# Patient Record
Sex: Male | Born: 1970 | Race: Black or African American | Hispanic: No | Marital: Single | Smoking: Current every day smoker
Health system: Southern US, Community
[De-identification: ages and names within clinical notes are randomized; demographics above are authoritative.]

## PROBLEM LIST (undated history)

## (undated) DIAGNOSIS — E7800 Pure hypercholesterolemia, unspecified: Secondary | ICD-10-CM

## (undated) DIAGNOSIS — I10 Essential (primary) hypertension: Secondary | ICD-10-CM

## (undated) DIAGNOSIS — E119 Type 2 diabetes mellitus without complications: Secondary | ICD-10-CM

## (undated) HISTORY — PX: ENUCLEATION: SHX628

## (undated) HISTORY — PX: DIAPHRAGM SURGERY: SHX612

---

## 2003-06-30 ENCOUNTER — Inpatient Hospital Stay (HOSPITAL_COMMUNITY): Admission: EM | Admit: 2003-06-30 | Discharge: 2003-07-05 | Payer: Self-pay | Admitting: Psychiatry

## 2010-02-19 ENCOUNTER — Inpatient Hospital Stay: Payer: Self-pay | Admitting: Psychiatry

## 2010-03-26 ENCOUNTER — Ambulatory Visit: Payer: Self-pay | Admitting: Emergency Medicine

## 2010-06-06 ENCOUNTER — Ambulatory Visit: Payer: Self-pay | Admitting: Otolaryngology

## 2010-09-02 ENCOUNTER — Emergency Department: Payer: Self-pay | Admitting: Internal Medicine

## 2010-09-14 ENCOUNTER — Emergency Department: Payer: Self-pay | Admitting: Emergency Medicine

## 2010-09-14 IMAGING — CR DG CHEST 1V PORT
1 series · 1 of 1 positions shown · non-contrast
Comparison: none

REASON FOR EXAM: Chest Pain
COMMENTS:

PROCEDURE:     DXR - DXR PORTABLE CHEST SINGLE VIEW  - [DATE]  [DATE]
RESULT:     Mild right base atelectatic changes versus infiltrate noted. The
left lung is clear. The cardiovascular structures are unremarkable.

[view not recorded]
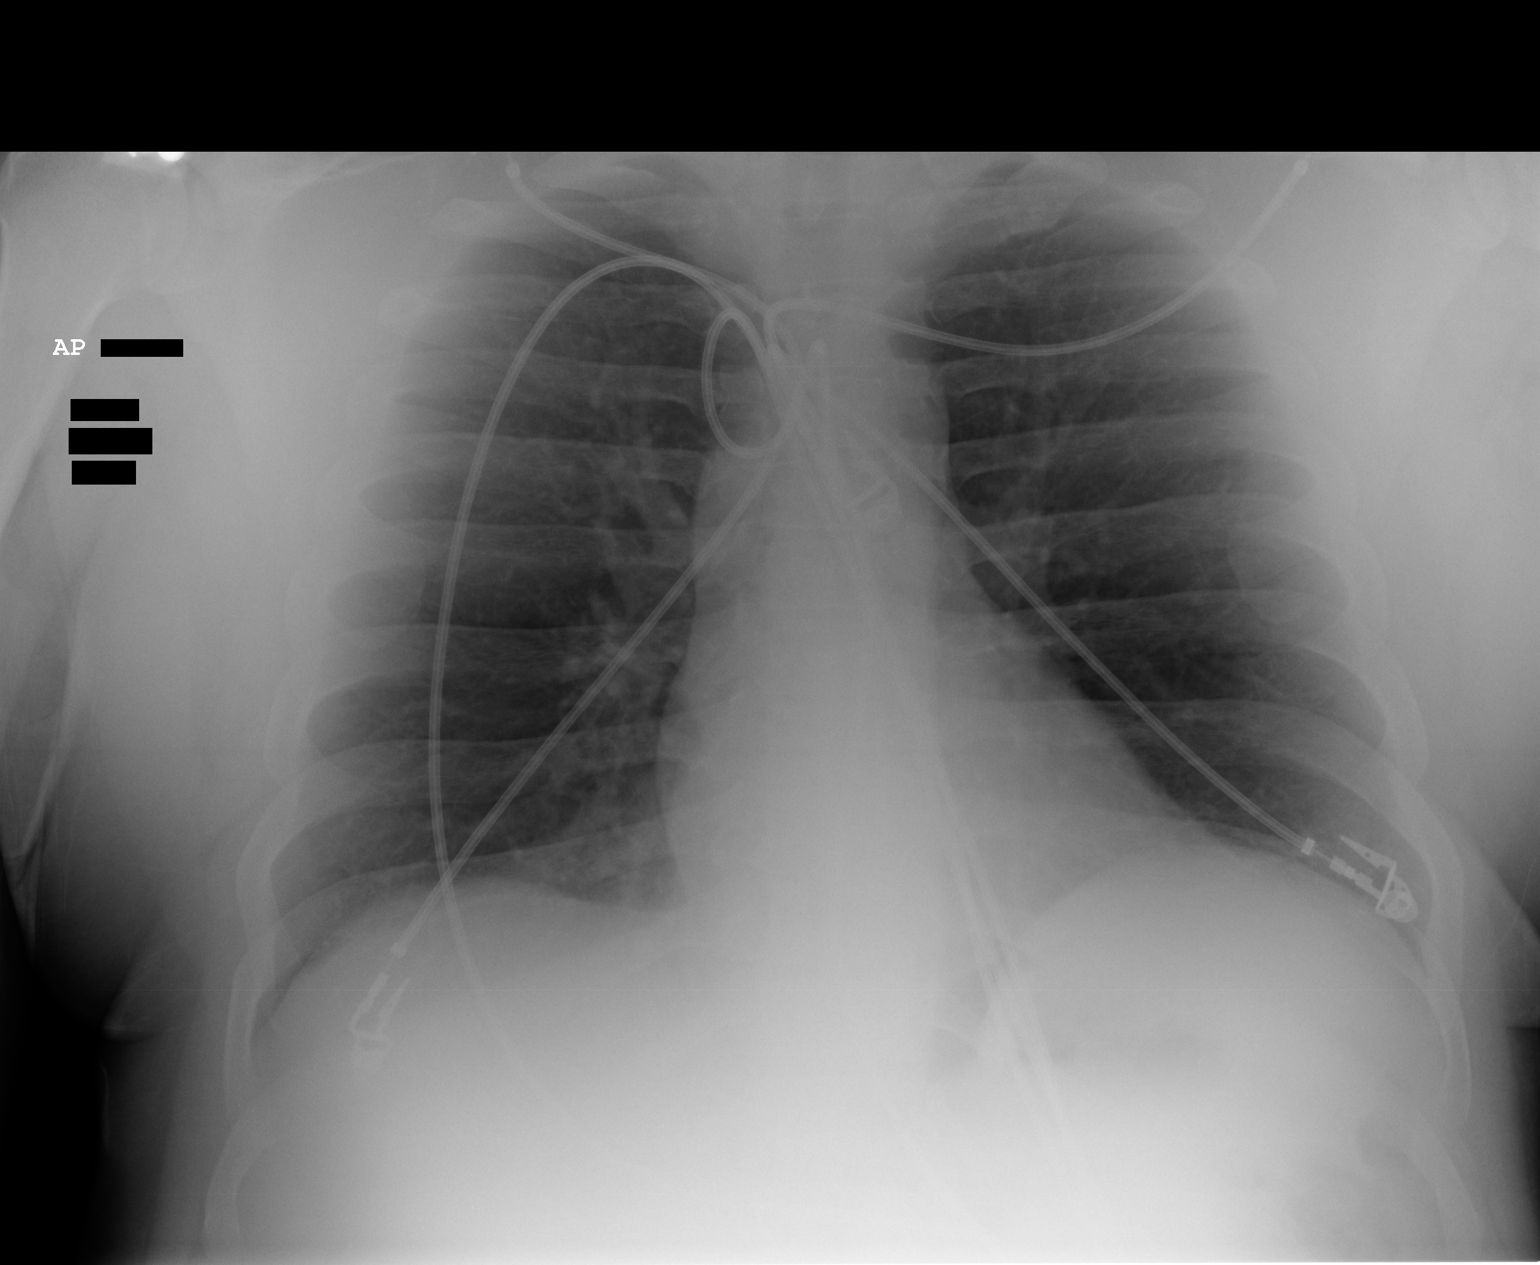

[1 of 1 positions shown; findings below may reference images not displayed]

IMPRESSION: Mild right base atelectatic changes. Pneumonia cannot be
excluded.

## 2010-09-14 IMAGING — CT CT CHEST W/ CM
2 series · 16 of 32 positions shown, 20 images · IV contrast (APPLIED)
Comparison: none

REASON FOR EXAM: chest pain  tachycardia
COMMENTS:

PROCEDURE:     CT  - CT CHEST (FOR PE) W  - [DATE]  [DATE]
RESULT:     History: Chest pain and tachycardia.

[Series 4: soft tissue · axial · 0.80mm/px · z∈[-332,-284]mm · 2 of 106 slices shown]
[im 9/106  mediastinal]
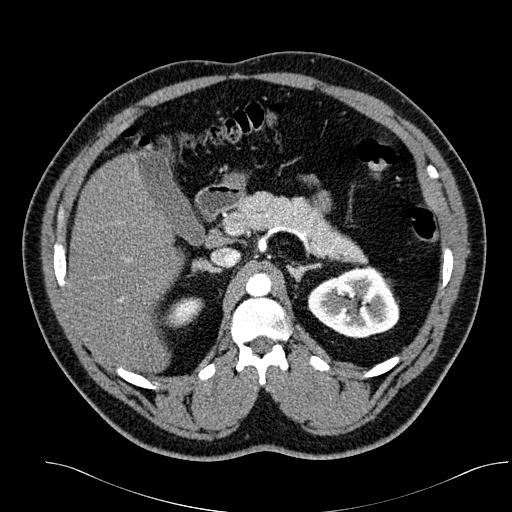
[im 25/106  mediastinal]
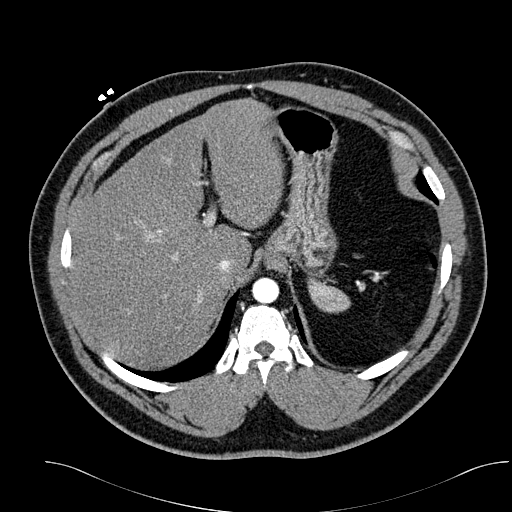

[Series 5: lung windows · axial · 0.80mm/px · z∈[-328,-64]mm · 14 of 104 slices shown, 18 images]
[im 8/104  mediastinal]
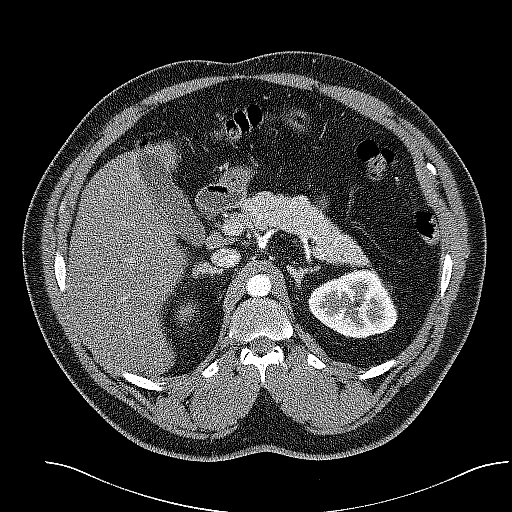
[im 8/104  lung]
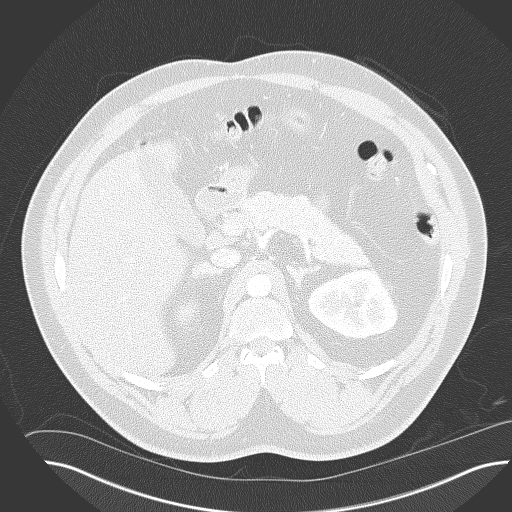
[im 16/104  lung]
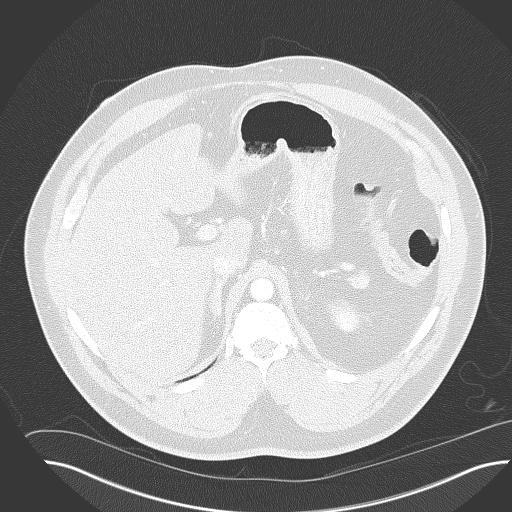
[im 24/104  lung]
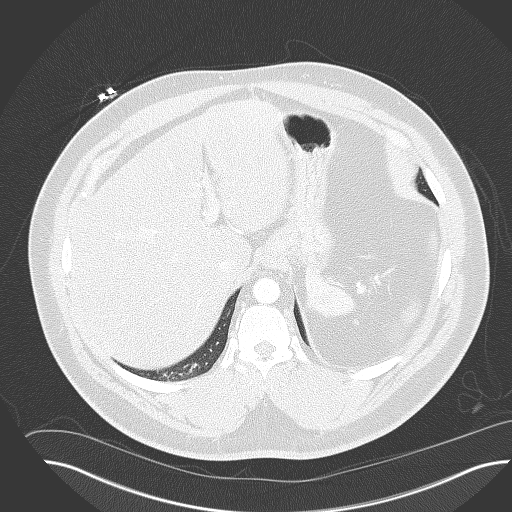
[im 32/104  lung]
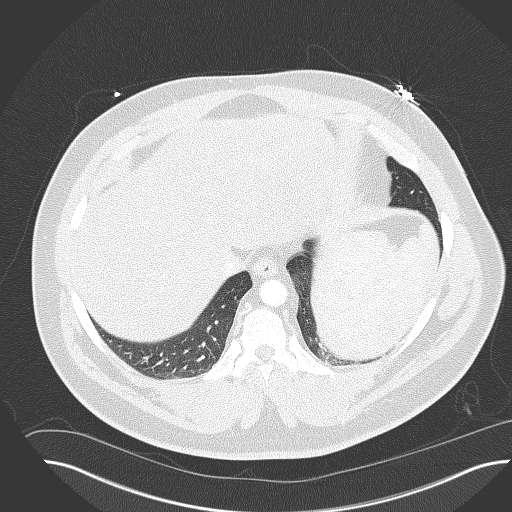
[im 40/104  mediastinal]
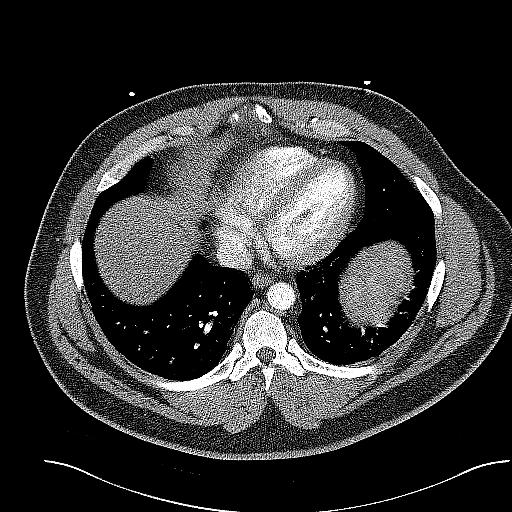
[im 40/104  lung]
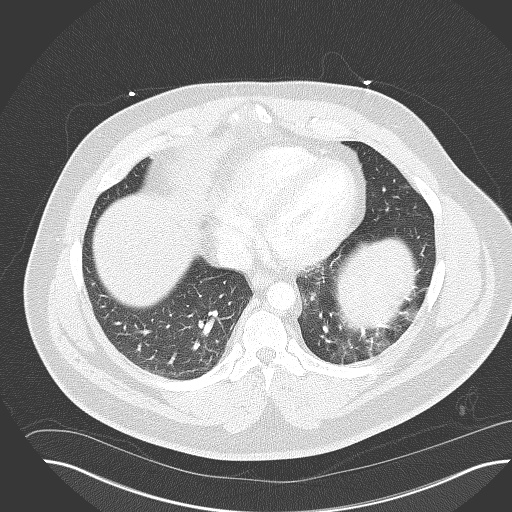
[im 48/104  lung]
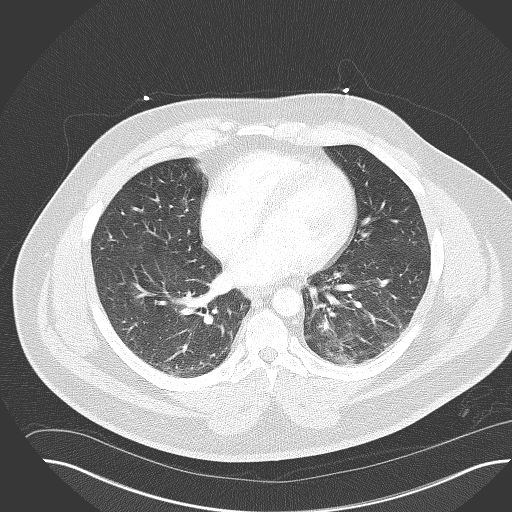
[im 49/104  lung]
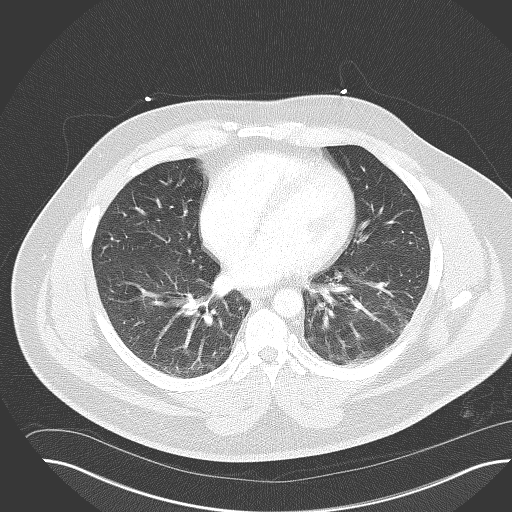
[im 52/104  lung]
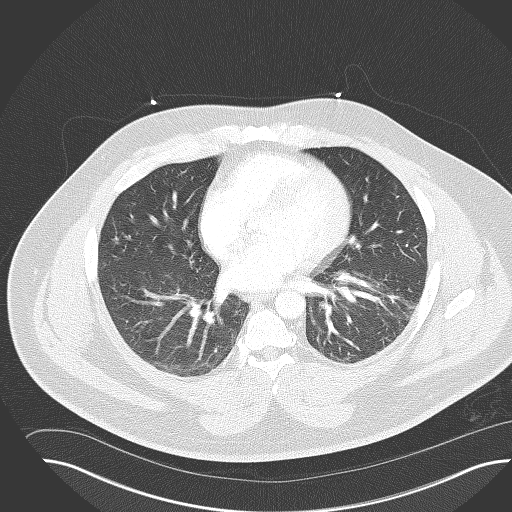
[im 56/104  mediastinal]
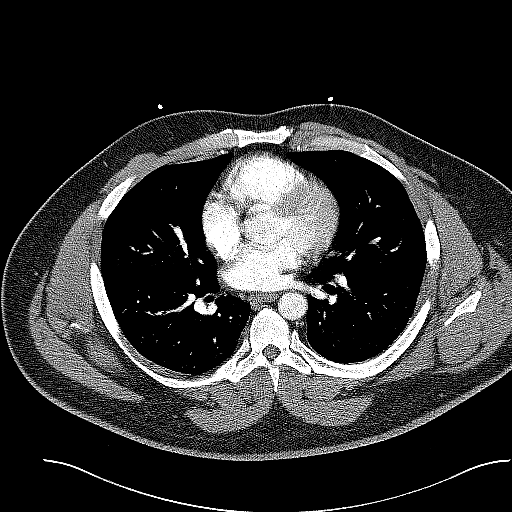
[im 56/104  lung]
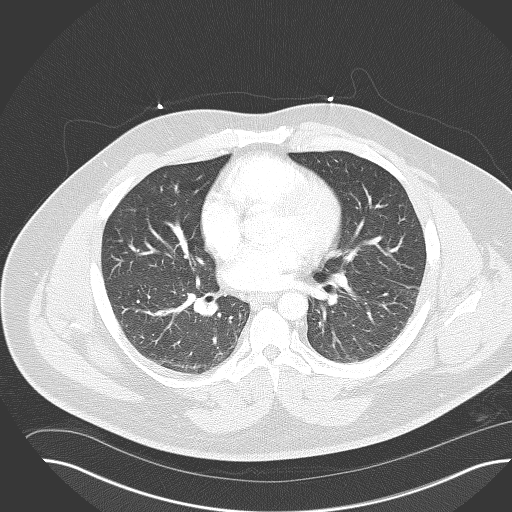
[im 64/104  lung]
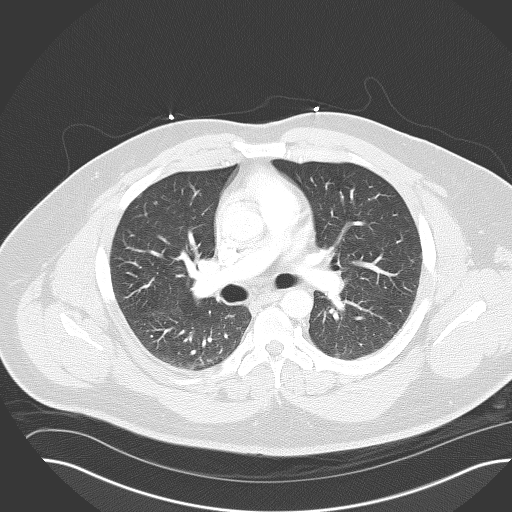
[im 72/104  lung]
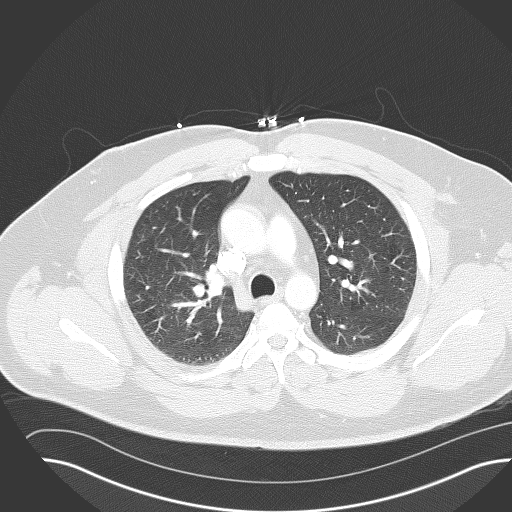
[im 80/104  lung]
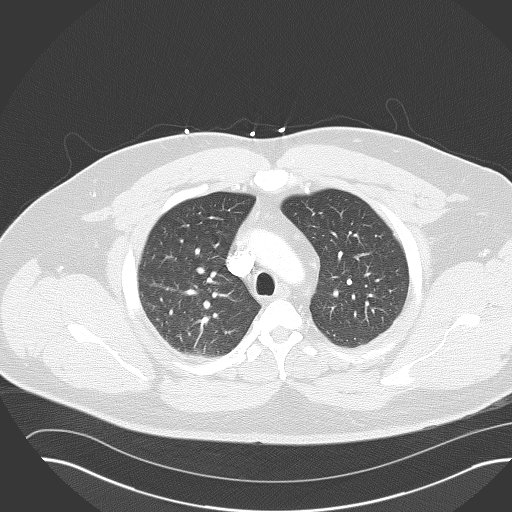
[im 88/104  mediastinal]
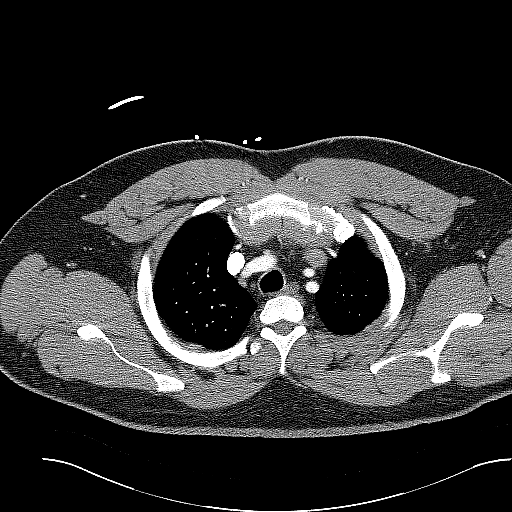
[im 88/104  lung]
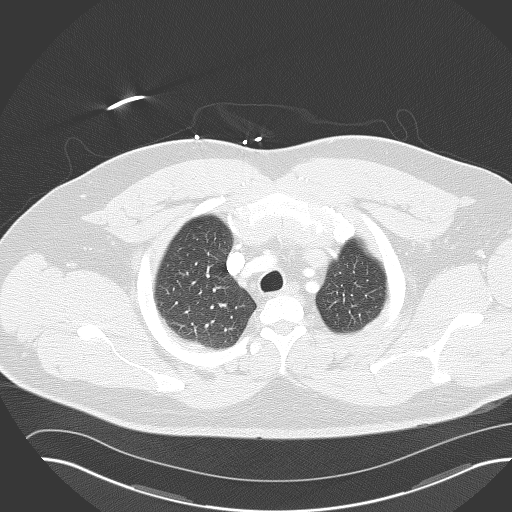
[im 96/104  lung]
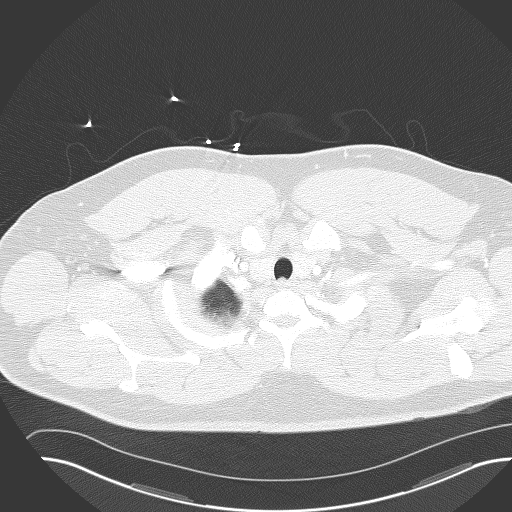

[16 of 32 positions shown; findings below may reference images not displayed]

FINDINGS: Standard CT obtained with 100 cc of [3S]. Thoracic aorta
normal. Adrenals normal. Pulmonary arteries normal. Heart size is normal.
Large airways patent. Bibasal atelectasis and/or pneumonia noted
particularly the left.
IMPRESSION: Bibasal atelectasis and/or pneumonia. No pulmonary embolus.

## 2010-12-16 ENCOUNTER — Ambulatory Visit: Payer: Self-pay | Admitting: Internal Medicine

## 2010-12-17 ENCOUNTER — Ambulatory Visit: Payer: Self-pay | Admitting: Gastroenterology

## 2010-12-23 ENCOUNTER — Ambulatory Visit: Payer: Self-pay | Admitting: Gastroenterology

## 2010-12-23 IMAGING — US ULTRASOUND CORE BIOPSY
1 series · 8 of 8 positions shown · non-contrast
Comparison: none

REASON FOR EXAM: Hepatitis C
COMMENTS:

[Series 1: ultrasound core biopsy · 8 of 8 slices shown]
[im 1/8]
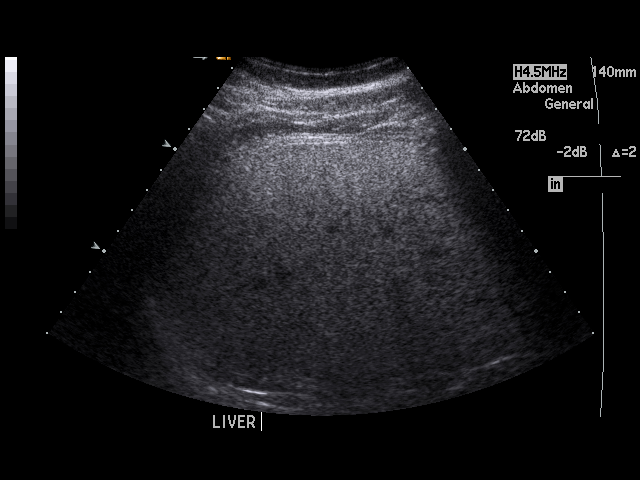
[im 2/8]
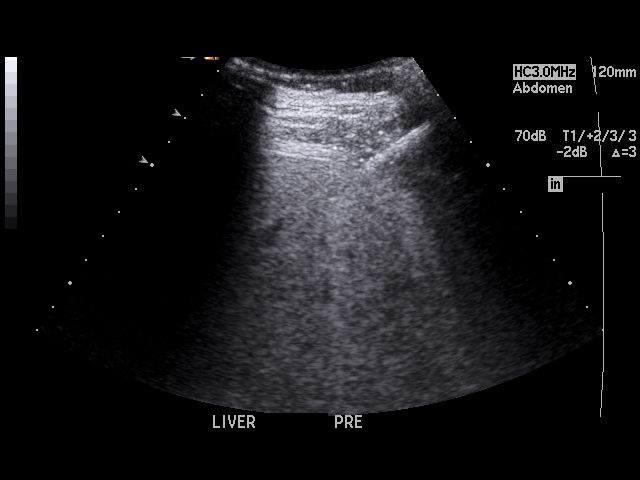
[im 3/8]
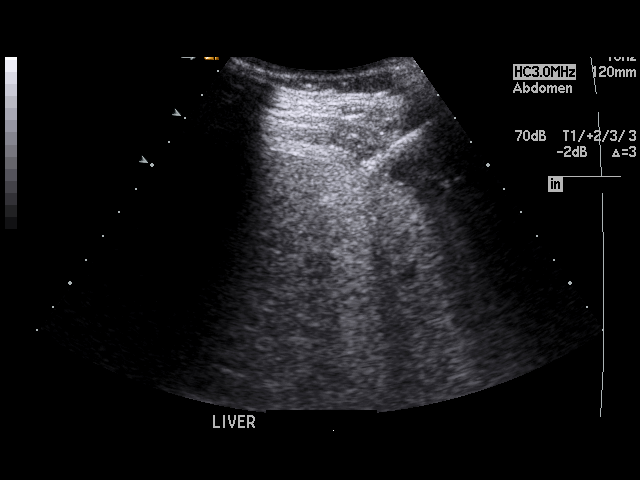
[im 4/8]
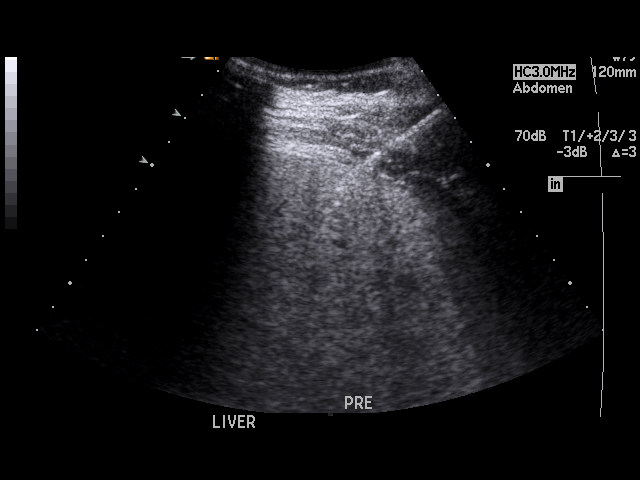
[im 5/8]
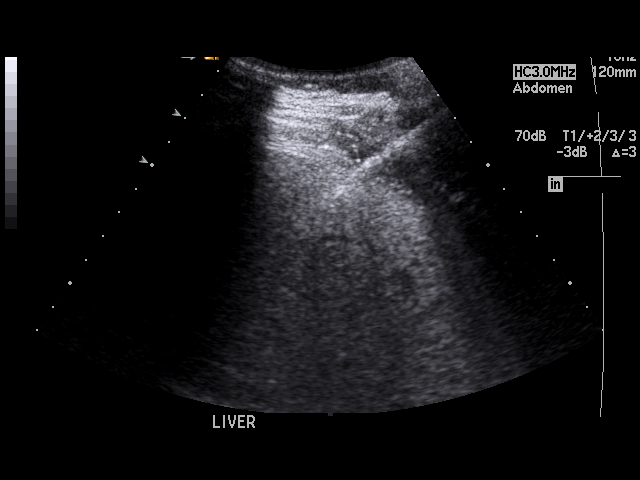
[im 6/8]
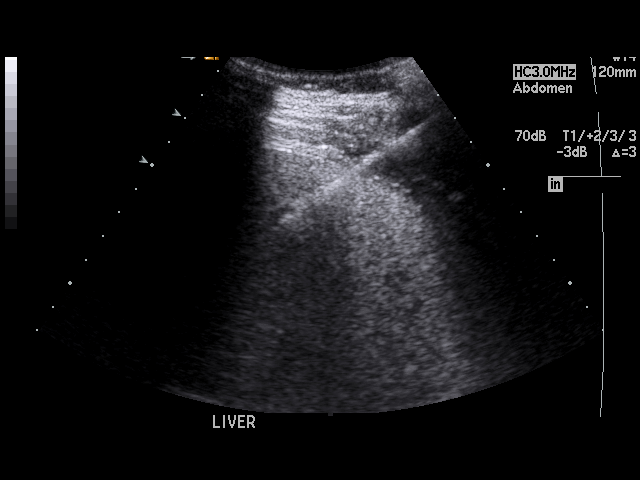
[im 7/8]
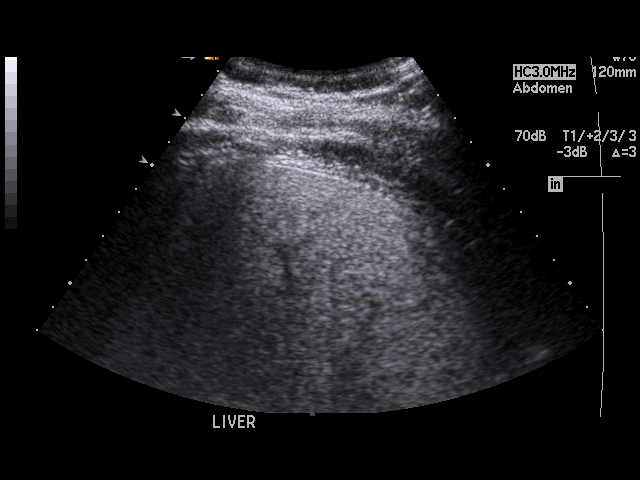
[im 8/8]
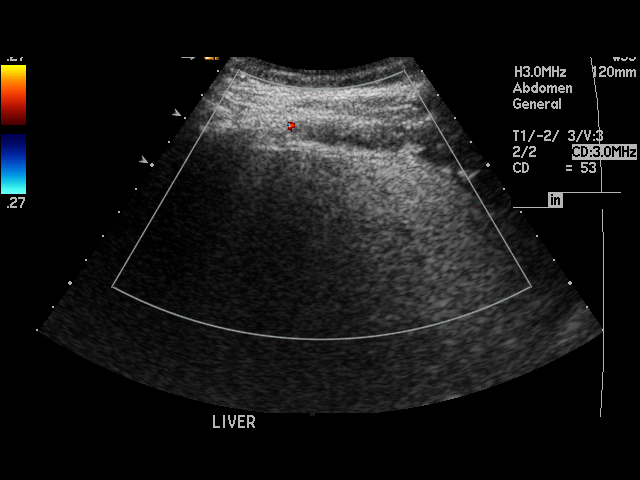

[8 of 8 positions shown; findings below may reference images not displayed]

PROCEDURE:     US  - US GUIDED BX/ASPIRATION NOT BR  - [DATE]  [DATE]

RESULT:     Comparisons:  None

Procedure:

The right flank and abdomen were prepped and draped in the usual sterile
manner. The skin and planned biopsy tract to the liver capsule were
anesthetized with 3 mL 1% lidocaine. Under ultrasound guidance, a 17-gauge
coaxial needle was advanced into the right hepatic lobe. A path was chosen
to avoid the hepatic vessels. Then, two core needle biopsies of the right
hepatic lobe were obtained with an 18 gauge Achieve and submitted to
pathology.

Hemostasis was achieved. The puncture site was cleansed and dressed with a
sterile bandage. The procedure was well tolerated and without complication.
The patient was transferred to the recovery unit in stable condition.
IMPRESSION: Uncomplicated ultrasound guided random liver core-biopsy of the right
hepatic lobe.

## 2010-12-27 ENCOUNTER — Ambulatory Visit: Payer: Self-pay | Admitting: Internal Medicine

## 2012-05-17 ENCOUNTER — Emergency Department: Payer: Self-pay | Admitting: Emergency Medicine

## 2012-05-21 ENCOUNTER — Emergency Department: Payer: Self-pay | Admitting: Internal Medicine

## 2015-01-01 ENCOUNTER — Emergency Department
Admission: EM | Admit: 2015-01-01 | Discharge: 2015-01-01 | Disposition: A | Discharge status: Home / Self Care | Payer: No Typology Code available for payment source | Attending: Emergency Medicine | Admitting: Emergency Medicine

## 2015-01-01 ENCOUNTER — Encounter: Payer: Self-pay | Admitting: Emergency Medicine

## 2015-01-01 DIAGNOSIS — S39012A Strain of muscle, fascia and tendon of lower back, initial encounter: Secondary | ICD-10-CM | POA: Insufficient documentation

## 2015-01-01 DIAGNOSIS — Y998 Other external cause status: Secondary | ICD-10-CM | POA: Insufficient documentation

## 2015-01-01 DIAGNOSIS — S4992XA Unspecified injury of left shoulder and upper arm, initial encounter: Secondary | ICD-10-CM | POA: Diagnosis not present

## 2015-01-01 DIAGNOSIS — S40812A Abrasion of left upper arm, initial encounter: Secondary | ICD-10-CM | POA: Insufficient documentation

## 2015-01-01 DIAGNOSIS — Y9389 Activity, other specified: Secondary | ICD-10-CM | POA: Diagnosis not present

## 2015-01-01 DIAGNOSIS — S3991XA Unspecified injury of abdomen, initial encounter: Secondary | ICD-10-CM | POA: Diagnosis not present

## 2015-01-01 DIAGNOSIS — Z720 Tobacco use: Secondary | ICD-10-CM | POA: Diagnosis not present

## 2015-01-01 DIAGNOSIS — S199XXA Unspecified injury of neck, initial encounter: Secondary | ICD-10-CM | POA: Diagnosis present

## 2015-01-01 DIAGNOSIS — I10 Essential (primary) hypertension: Secondary | ICD-10-CM | POA: Diagnosis not present

## 2015-01-01 DIAGNOSIS — E119 Type 2 diabetes mellitus without complications: Secondary | ICD-10-CM | POA: Insufficient documentation

## 2015-01-01 DIAGNOSIS — S0990XA Unspecified injury of head, initial encounter: Secondary | ICD-10-CM | POA: Diagnosis not present

## 2015-01-01 DIAGNOSIS — Y92410 Unspecified street and highway as the place of occurrence of the external cause: Secondary | ICD-10-CM | POA: Diagnosis not present

## 2015-01-01 DIAGNOSIS — S5012XA Contusion of left forearm, initial encounter: Secondary | ICD-10-CM | POA: Insufficient documentation

## 2015-01-01 DIAGNOSIS — S161XXA Strain of muscle, fascia and tendon at neck level, initial encounter: Secondary | ICD-10-CM

## 2015-01-01 HISTORY — DX: Type 2 diabetes mellitus without complications: E11.9

## 2015-01-01 HISTORY — DX: Essential (primary) hypertension: I10

## 2015-01-01 HISTORY — DX: Pure hypercholesterolemia, unspecified: E78.00

## 2015-01-01 MED ORDER — KETOROLAC TROMETHAMINE 10 MG PO TABS: 10.0000 mg | ORAL_TABLET | Freq: Three times a day (TID) | ORAL | Status: DC

## 2015-01-01 MED ORDER — TRAMADOL HCL 50 MG PO TABS: 50.0000 mg | ORAL_TABLET | Freq: Two times a day (BID) | ORAL | Status: DC

## 2015-01-01 MED ORDER — HYDROCODONE-ACETAMINOPHEN 5-325 MG PO TABS: 1.0000 | ORAL_TABLET | ORAL | Status: DC | PRN

## 2015-01-01 MED ORDER — CYCLOBENZAPRINE HCL 5 MG PO TABS: 5.0000 mg | ORAL_TABLET | Freq: Three times a day (TID) | ORAL | Status: DC | PRN
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
## 2015-01-01 NOTE — Discharge Instructions (Signed)
Cervical Sprain A cervical sprain is an injury in the neck in which the strong, fibrous tissues (ligaments) that connect your neck bones stretch or tear. Cervical sprains can range from mild to severe. Severe cervical sprains can cause the neck vertebrae to be unstable. This can lead to damage of the spinal cord and can result in serious nervous system problems. The amount of time it takes for a cervical sprain to get better depends on the cause and extent of the injury. Most cervical sprains heal in 1 to 3 weeks. CAUSES  Severe cervical sprains may be caused by:   Contact sport injuries (such as from football, rugby, wrestling, hockey, auto racing, gymnastics, diving, martial arts, or boxing).   Motor vehicle collisions.   Whiplash injuries. This is an injury from a sudden forward and backward whipping movement of the head and neck.  Falls.  Mild cervical sprains may be caused by:   Being in an awkward position, such as while cradling a telephone between your ear and shoulder.   Sitting in a chair that does not offer proper support.   Working at a poorly [employer]scape [employer] station.   Looking up or down for long periods of time.  SYMPTOMS   Pain, soreness, stiffness, or a burning sensation in the front, back, or sides of the neck. This discomfort may develop immediately after the injury or slowly, 24 hours or more after the injury.   Pain or tenderness directly in the middle of the back of the neck.   Shoulder or upper back pain.   Limited ability to move the neck.   Headache.   Dizziness.   Weakness, numbness, or tingling in the hands or arms.   Muscle spasms.   Difficulty swallowing or chewing.   Tenderness and swelling of the neck.  DIAGNOSIS  Most of the time your health care provider can diagnose a cervical sprain by taking your history and doing a physical exam. Your health care provider will ask about previous neck injuries and any known neck  problems, such as arthritis in the neck. X-rays may be taken to find out if there are any other problems, such as with the bones of the neck. Other tests, such as a CT scan or MRI, may also be needed.  TREATMENT  Treatment depends on the severity of the cervical sprain. Mild sprains can be treated with rest, keeping the neck in place (immobilization), and pain medicines. Severe cervical sprains are immediately immobilized. Further treatment is done to help with pain, muscle spasms, and other symptoms and may include:  Medicines, such as pain relievers, numbing medicines, or muscle relaxants.   Physical therapy. This may involve stretching exercises, strengthening exercises, and posture training. Exercises and improved posture can help stabilize the neck, strengthen muscles, and help stop symptoms from returning.  HOME CARE INSTRUCTIONS   Put ice on the injured area.   Put ice in a plastic bag.   Place a towel between your skin and the bag.   Leave the ice on for 15-20 minutes, 3-4 times a day.   If your injury was severe, you may have been given a cervical collar to wear. A cervical collar is a two-piece collar designed to keep your neck from moving while it heals.  Do not remove the collar unless instructed by your health care provider.  If you have long hair, keep it outside of the collar.  Ask your health care provider before making any adjustments to your collar. Minor  adjustments may be required over time to improve comfort and reduce pressure on your chin or on the back of your head.  Ifyou are allowed to remove the collar for cleaning or bathing, follow your health care provider's instructions on how to do so safely.  Keep your collar clean by wiping it with mild soap and water and drying it completely. If the collar you have been given includes removable pads, remove them every 1-2 days and hand wash them with soap and water. Allow them to air dry. They should be completely  dry before you wear them in the collar.  If you are allowed to remove the collar for cleaning and bathing, wash and dry the skin of your neck. Check your skin for irritation or sores. If you see any, tell your health care provider.  Do not drive while wearing the collar.   Only take over-the-counter or prescription medicines for pain, discomfort, or fever as directed by your health care provider.   Keep all follow-up appointments as directed by your health care provider.   Keep all physical therapy appointments as directed by your health care provider.   Make any needed adjustments to your workstation to promote good posture.   Avoid positions and activities that make your symptoms worse.   Warm up and stretch before being active to help prevent problems.  SEEK MEDICAL CARE IF:   Your pain is not controlled with medicine.   You are unable to decrease your pain medicine over time as planned.   Your activity level is not improving as expected.  SEEK IMMEDIATE MEDICAL CARE IF:   You develop any bleeding.  You develop stomach upset.  You have signs of an allergic reaction to your medicine.   Your symptoms get worse.   You develop new, unexplained symptoms.   You have numbness, tingling, weakness, or paralysis in any part of your body.  MAKE SURE YOU:   Understand these instructions.  Will watch your condition.  Will get help right away if you are not doing well or get worse. Document Released: 05/11/2007 Document Revised: 07/19/2013 Document Reviewed: 01/19/2013 [facility] Patient Information 2015 [place], [place]. This information is not intended to replace advice given to you by your health care provider. Make sure you discuss any questions you have with your health care provider.   Lumbosacral Strain Lumbosacral strain is a strain of any of the parts that make up your lumbosacral vertebrae. Your lumbosacral vertebrae are the bones that make up the lower  third of your backbone. Your lumbosacral vertebrae are held together by muscles and tough, fibrous tissue (ligaments).  CAUSES  A sudden blow to your back can cause lumbosacral strain. Also, anything that causes an excessive stretch of the muscles in the low back can cause this strain. This is typically seen when people exert themselves strenuously, fall, lift heavy objects, bend, or crouch repeatedly. RISK FACTORS  Physically demanding work.  Participation in pushing or pulling sports or sports that require a sudden twist of the back (tennis, golf, baseball).  Weight lifting.  Excessive lower back curvature.  Forward-tilted pelvis.  Weak back or abdominal muscles or both.  Tight hamstrings. SIGNS AND SYMPTOMS  Lumbosacral strain may cause pain in the area of your injury or pain that moves (radiates) down your leg.  DIAGNOSIS Your health care provider can often diagnose lumbosacral strain through a physical exam. In some cases, you may need tests such as X-ray exams.  TREATMENT  Treatment for your  lower back injury depends on many factors that your clinician will have to evaluate. However, most treatment will include the use of anti-inflammatory medicines. HOME CARE INSTRUCTIONS   Avoid hard physical activities (tennis, racquetball, waterskiing) if you are not in proper physical condition for it. This may aggravate or create problems.  If you have a back problem, avoid sports requiring sudden body movements. Swimming and walking are generally safer activities.  Maintain good posture.  Maintain a healthy weight.  For acute conditions, you may put ice on the injured area.  Put ice in a plastic bag.  Place a towel between your skin and the bag.  Leave the ice on for 20 minutes, 2-3 times a day.  When the low back starts healing, stretching and strengthening exercises may be recommended. SEEK MEDICAL CARE IF:  Your back pain is getting worse.  You experience severe back  pain not relieved with medicines. SEEK IMMEDIATE MEDICAL CARE IF:   You have numbness, tingling, weakness, or problems with the use of your arms or legs.  There is a change in bowel or bladder control.  You have increasing pain in any area of the body, including your belly (abdomen).  You notice shortness of breath, dizziness, or feel faint.  You feel sick to your stomach (nauseous), are throwing up (vomiting), or become sweaty.  You notice discoloration of your toes or legs, or your feet get very cold. MAKE SURE YOU:   Understand these instructions.  Will watch your condition.  Will get help right away if you are not doing well or get worse. Document Released: 04/23/2005 Document Revised: 07/19/2013 Document Reviewed: 03/02/2013 [facility] Patient Information 2015 [place]. This information is not intended to replace advice given to you by your health care provider. Make sure you discuss any questions you have with your health care provider.  Motor Vehicle Collision After a car crash (motor vehicle collision), it is normal to have bruises and sore muscles. The first 24 hours usually feel the worst. After that, you will likely start to feel better each day. HOME CARE  Put ice on the injured area.  Put ice in a plastic bag.  Place a towel between your skin and the bag.  Leave the ice on for 15-20 minutes, 03-04 times a day.  Drink enough fluids to keep your pee (urine) clear or pale yellow.  Do not drink alcohol.  Take a warm shower or bath 1 or 2 times a day. This helps your sore muscles.  Return to activities as told by your doctor. Be careful when lifting. Lifting can make neck or back pain worse.  Only take medicine as told by your doctor. Do not use aspirin. GET HELP RIGHT AWAY IF:   Your arms or legs tingle, feel weak, or lose feeling (numbness).  You have headaches that do not get better with medicine.  You have neck pain, especially in the middle of the  back of your neck.  You cannot control when you pee (urinate) or poop (bowel movement).  Pain is getting worse in any part of your body.  You are short of breath, dizzy, or pass out (faint).  You have chest pain.  You feel sick to your stomach (nauseous), throw up (vomit), or sweat.  You have belly (abdominal) pain that gets worse.  There is blood in your pee, poop, or throw up.  You have pain in your shoulder (shoulder strap areas).  Your problems are getting worse. MAKE SURE YOU:  Understand these instructions.  Will watch your condition.  Will get help right away if you are not doing well or get worse. Document Released: 12/31/2007 Document Revised: 10/06/2011 Document Reviewed: 12/11/2010 [facility] Patient Information 2015 [place], [place]. This information is not intended to replace advice given to you by your health care provider. Make sure you discuss any questions you have with your health care provider.   Your exam is essentially normal.  You should take the prescription meds as directed. Apply ice to reduce symptoms.  Follow-up with your provider or return as needed for worsening symptoms.

## 2016-04-25 ENCOUNTER — Ambulatory Visit: Payer: Self-pay | Admitting: Podiatry

## 2018-08-26 ENCOUNTER — Other Ambulatory Visit: Payer: Self-pay | Admitting: Physician Assistant

## 2018-08-26 DIAGNOSIS — H9041 Sensorineural hearing loss, unilateral, right ear, with unrestricted hearing on the contralateral side: Secondary | ICD-10-CM

## 2018-08-26 DIAGNOSIS — IMO0001 Reserved for inherently not codable concepts without codable children: Secondary | ICD-10-CM

## 2018-09-06 ENCOUNTER — Ambulatory Visit
Admission: RE | Admit: 2018-09-06 | Discharge: 2018-09-06 | Disposition: A | Discharge status: Home / Self Care | Payer: Medicaid Other | Source: Ambulatory Visit | Attending: Physician Assistant | Admitting: Physician Assistant

## 2018-09-06 ENCOUNTER — Other Ambulatory Visit: Payer: Self-pay | Admitting: Physician Assistant

## 2018-09-06 DIAGNOSIS — H9041 Sensorineural hearing loss, unilateral, right ear, with unrestricted hearing on the contralateral side: Secondary | ICD-10-CM

## 2018-09-06 DIAGNOSIS — Z1389 Encounter for screening for other disorder: Secondary | ICD-10-CM

## 2018-09-06 DIAGNOSIS — Z0189 Encounter for other specified special examinations: Secondary | ICD-10-CM

## 2018-09-06 DIAGNOSIS — IMO0001 Reserved for inherently not codable concepts without codable children: Secondary | ICD-10-CM
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
## 2018-09-07 ENCOUNTER — Ambulatory Visit
Admission: RE | Admit: 2018-09-07 | Discharge: 2018-09-07 | Disposition: A | Discharge status: Home / Self Care | Payer: Medicaid Other | Source: Ambulatory Visit | Attending: Physician Assistant | Admitting: Physician Assistant

## 2018-09-07 ENCOUNTER — Ambulatory Visit: Payer: Medicaid Other

## 2018-09-07 DIAGNOSIS — IMO0001 Reserved for inherently not codable concepts without codable children: Secondary | ICD-10-CM

## 2018-09-07 DIAGNOSIS — H9041 Sensorineural hearing loss, unilateral, right ear, with unrestricted hearing on the contralateral side: Secondary | ICD-10-CM

## 2018-09-10 ENCOUNTER — Other Ambulatory Visit: Payer: Self-pay | Admitting: Physician Assistant

## 2018-09-10 DIAGNOSIS — H902 Conductive hearing loss, unspecified: Secondary | ICD-10-CM

## 2018-09-13 ENCOUNTER — Ambulatory Visit
Admission: RE | Admit: 2018-09-13 | Discharge: 2018-09-13 | Disposition: A | Discharge status: Home / Self Care | Payer: Medicaid Other | Source: Ambulatory Visit | Attending: Physician Assistant | Admitting: Physician Assistant

## 2018-09-13 DIAGNOSIS — H902 Conductive hearing loss, unspecified: Secondary | ICD-10-CM | POA: Insufficient documentation
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
## 2020-12-12 ENCOUNTER — Other Ambulatory Visit: Payer: Self-pay | Admitting: Emergency Medicine

## 2020-12-12 DIAGNOSIS — R519 Headache, unspecified: Secondary | ICD-10-CM

## 2020-12-12 DIAGNOSIS — M545 Low back pain, unspecified: Secondary | ICD-10-CM

## 2020-12-12 DIAGNOSIS — M25512 Pain in left shoulder: Secondary | ICD-10-CM

## 2020-12-19 ENCOUNTER — Other Ambulatory Visit: Payer: Self-pay | Admitting: Emergency Medicine

## 2020-12-19 DIAGNOSIS — R519 Headache, unspecified: Secondary | ICD-10-CM

## 2020-12-22 ENCOUNTER — Ambulatory Visit
Admission: RE | Admit: 2020-12-22 | Discharge: 2020-12-22 | Disposition: A | Discharge status: Home / Self Care | Payer: Medicaid Other | Source: Ambulatory Visit | Attending: Emergency Medicine | Admitting: Emergency Medicine

## 2020-12-22 ENCOUNTER — Other Ambulatory Visit: Payer: Self-pay

## 2020-12-22 ENCOUNTER — Ambulatory Visit: Admission: RE | Admit: 2020-12-22 | Payer: Medicaid Other | Source: Ambulatory Visit

## 2020-12-22 DIAGNOSIS — M545 Low back pain, unspecified: Secondary | ICD-10-CM | POA: Diagnosis present

## 2020-12-22 DIAGNOSIS — R519 Headache, unspecified: Secondary | ICD-10-CM

## 2020-12-22 DIAGNOSIS — M25512 Pain in left shoulder: Secondary | ICD-10-CM | POA: Diagnosis present
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
## 2020-12-22 IMAGING — MR MR SHOULDER*L* W/O CM
4 of 5 series · 31 of 40 positions shown · non-contrast
Comparison: None.

CLINICAL DATA: Left shoulder pain with limited range of motion

EXAM:
MRI OF THE LEFT SHOULDER WITHOUT CONTRAST
TECHNIQUE: Multiplanar, multisequence MR imaging of the shoulder was performed.
No intravenous contrast was administered.

[Series 3: T2 fat-sat · axial · left · 4.0mm · 0.50mm/px · z∈[-61,+59]mm · 8 of 26 slices shown (1 of 3)]
[im 1/26]
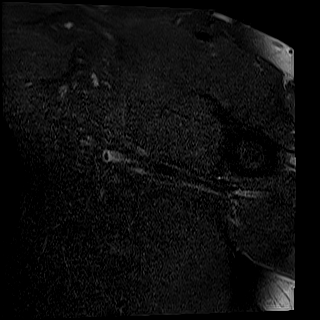
[im 4/26]
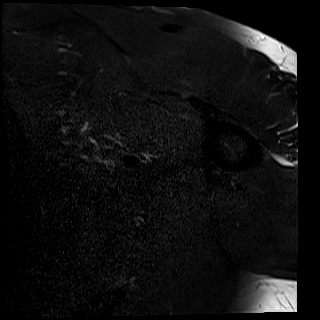
[im 8/26]
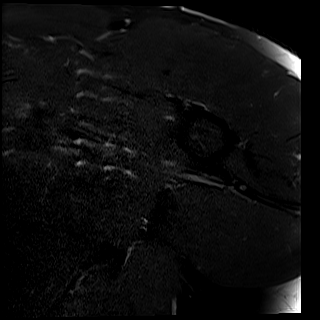
[im 11/26]
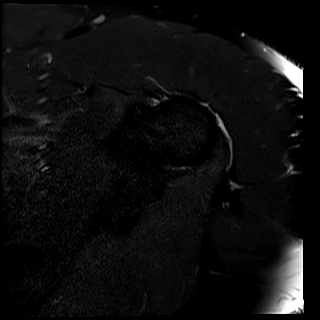
[im 15/26]
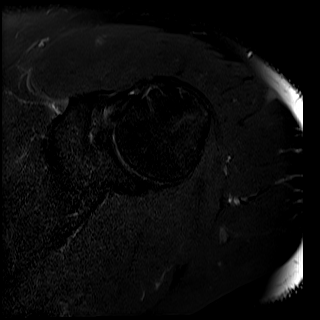
[im 18/26]
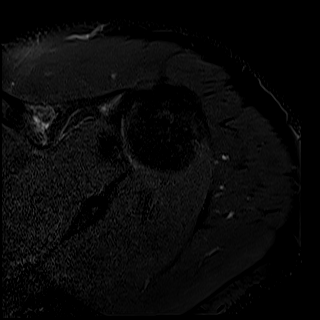
[im 22/26]
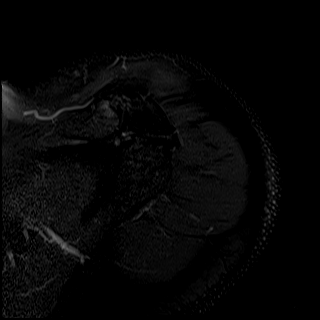
[im 26/26]
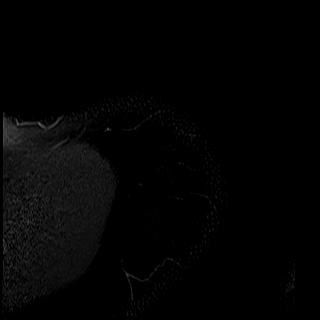

[Series 4: PD · oblique · left · 4.0mm · 0.44mm/px · 9 of 26 slices shown]
[im 1/26]
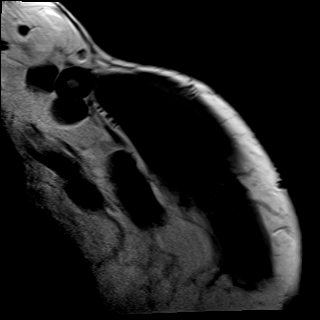
[im 4/26]
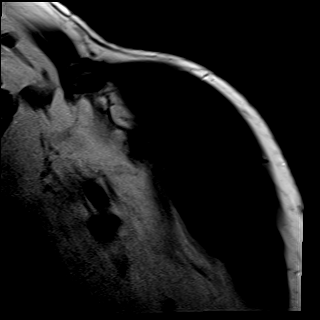
[im 7/26]
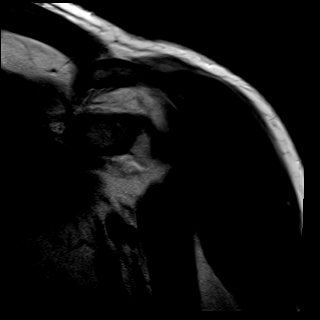
[im 10/26]
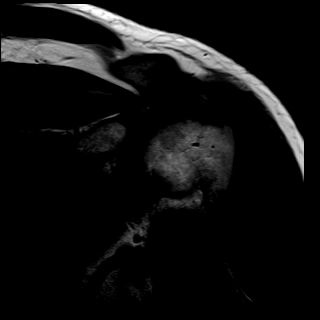
[im 13/26]
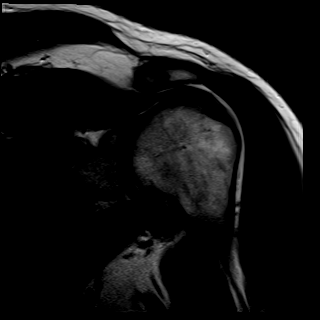
[im 16/26]
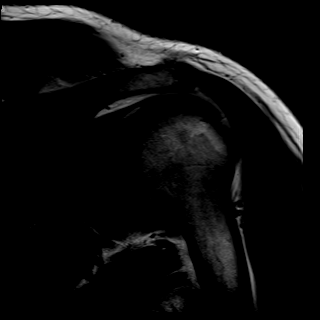
[im 19/26]
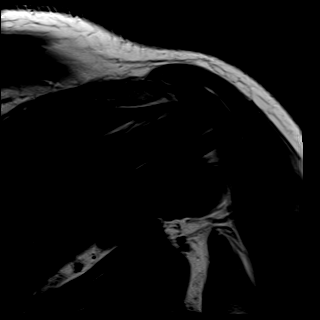
[im 22/26]
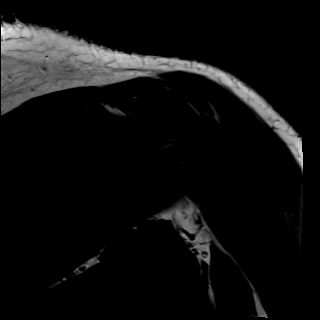
[im 26/26]
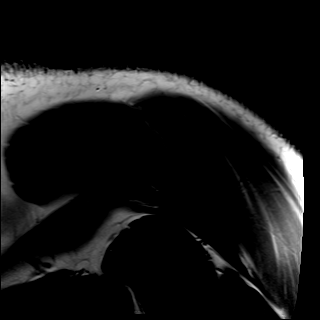

[Series 5: T2 fat-sat · oblique · left · 4.0mm · 0.44mm/px · 9 of 26 slices shown (2 of 3)]
[im 1/26]
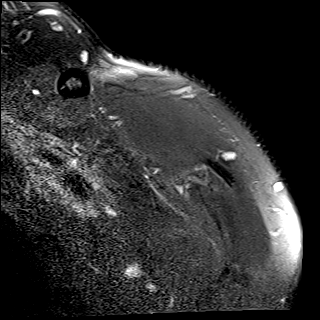
[im 4/26]
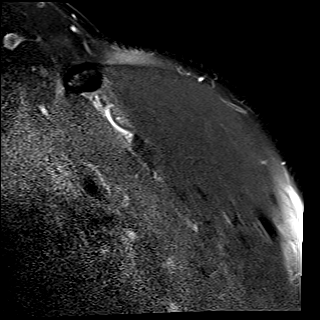
[im 7/26]
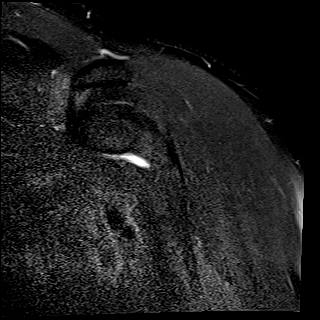
[im 10/26]
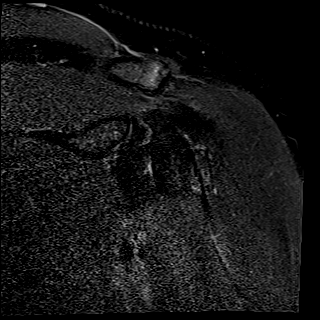
[im 13/26]
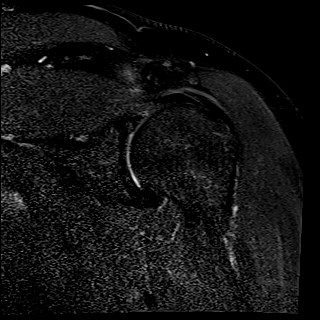
[im 16/26]
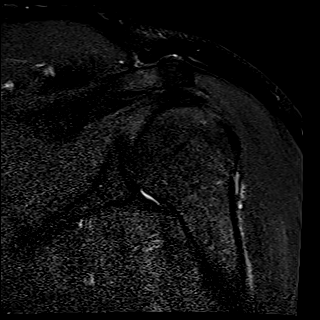
[im 19/26]
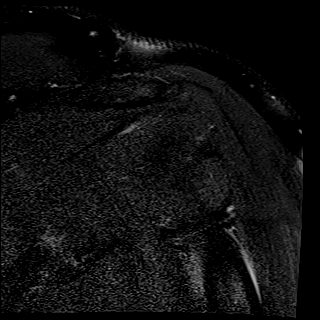
[im 22/26]
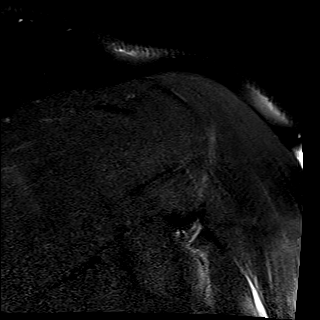
[im 26/26]
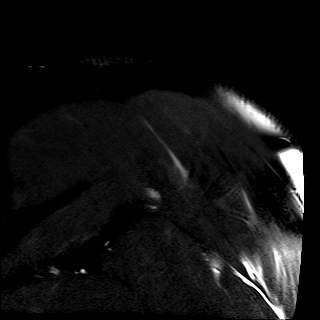

[Series 9: T2 fat-sat · oblique · left · 4.0mm · 0.22mm/px · 5 of 22 slices shown (3 of 3)]
[im 1/22]
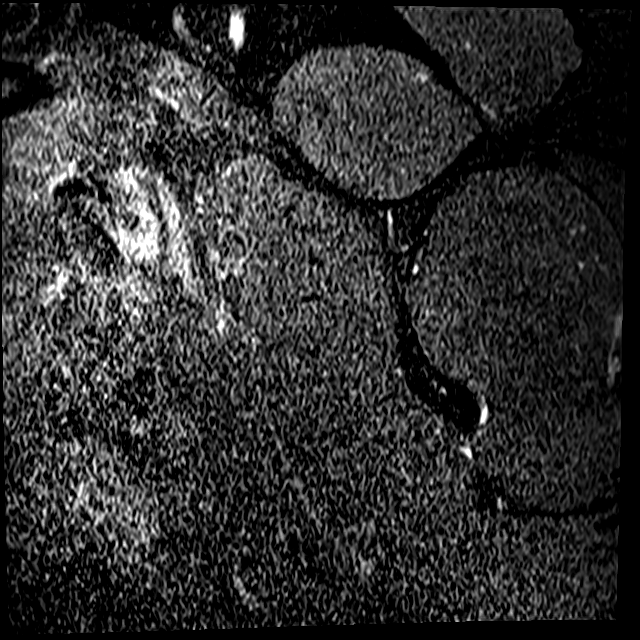
[im 4/22]
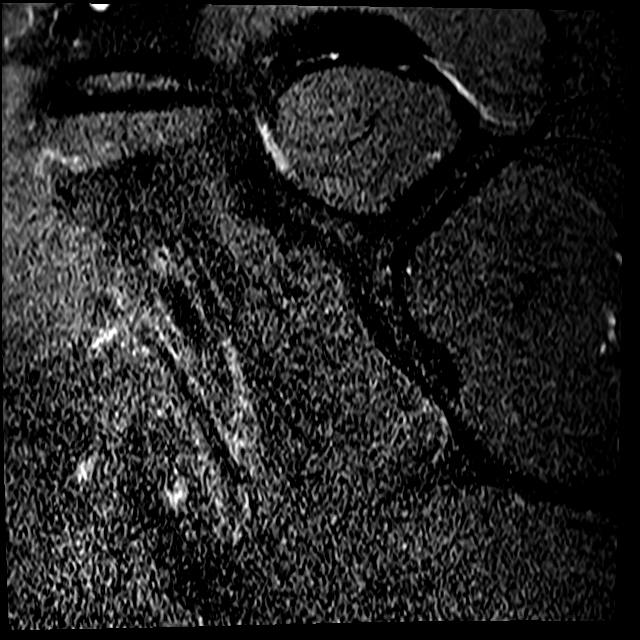
[im 8/22]
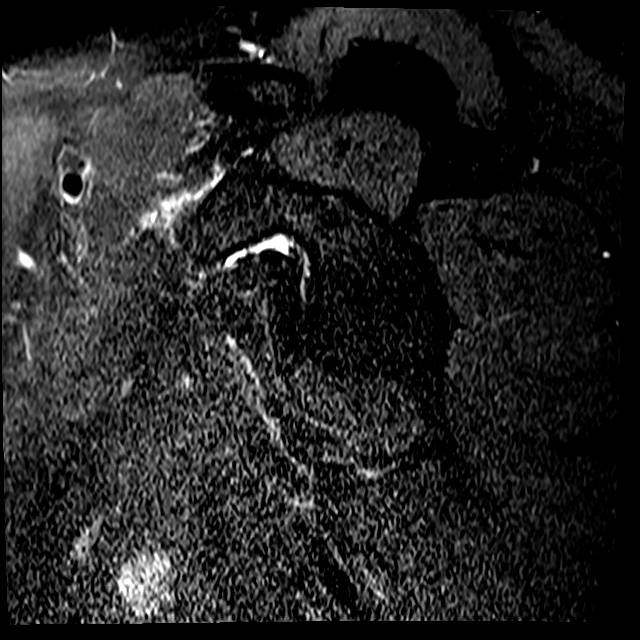
[im 11/22]
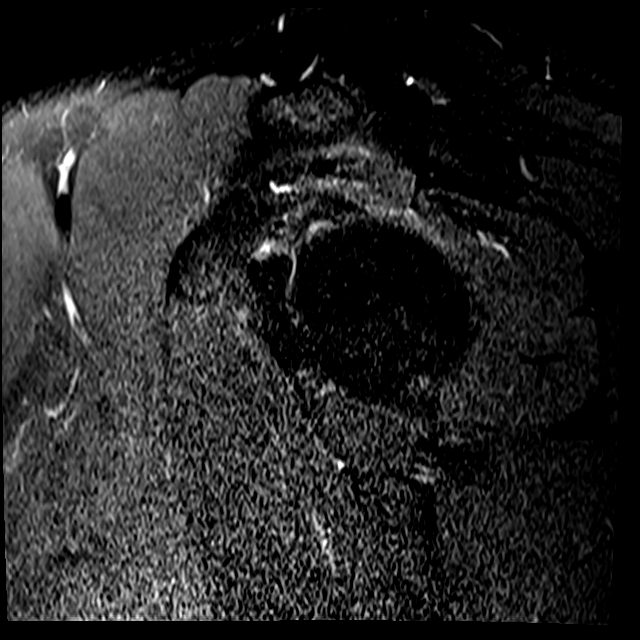
[im 18/22]
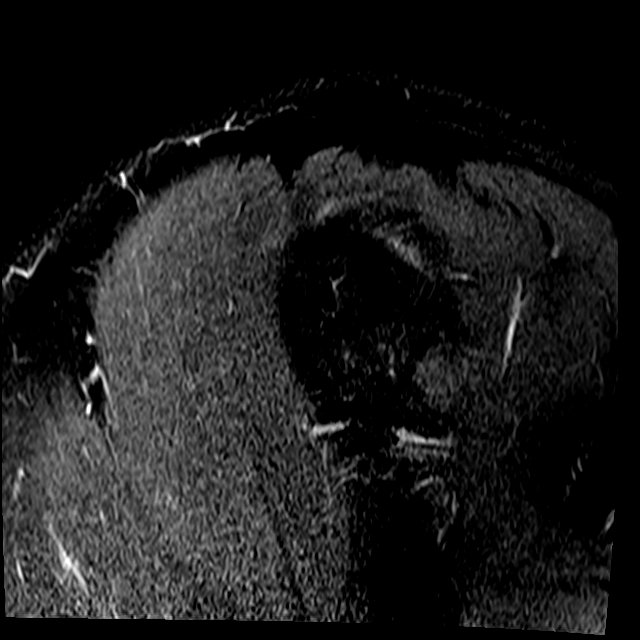

[31 of 40 positions shown; findings below may reference images not displayed]

FINDINGS: Rotator cuff: Mild tendinosis of the supraspinatus tendon. Mild
tendinosis of the infraspinatus tendon. Teres minor tendon is
intact. Subscapularis tendon is intact.

Muscles: No muscle atrophy. No intramuscular fluid collection or
hematoma. Mild muscle edema in the lateral aspect of the distal
deltoid muscle partially visualized concerning for muscle strain.

Biceps Long Head: Intraarticular and extraarticular portions of the
biceps tendon are intact.

Acromioclavicular Joint: Moderate arthropathy of the
acromioclavicular joint. Incidental note made of an os acromiale.
Type II acromion. No subacromial/subdeltoid bursal fluid.

Glenohumeral Joint: No joint effusion. No chondral defect.

Labrum: Grossly intact, but evaluation is limited by lack of
intraarticular fluid/contrast.

Bones: No fracture or dislocation. No aggressive osseous lesion.

Other: No fluid collection or hematoma.
IMPRESSION: 1. Mild tendinosis of the supraspinatus tendon.
2. Mild tendinosis of the infraspinatus tendon.
3. Mild muscle edema in the lateral aspect of the distal deltoid
muscle partially visualized concerning for muscle strain.

## 2022-09-03 ENCOUNTER — Other Ambulatory Visit: Payer: Self-pay | Admitting: Internal Medicine

## 2022-09-03 ENCOUNTER — Other Ambulatory Visit: Payer: Medicaid Other

## 2022-09-04 LAB — FRUCTOSAMINE: Fructosamine: 237 umol/L (ref 0–285)

## 2022-09-05 ENCOUNTER — Ambulatory Visit: Payer: Medicaid Other | Admitting: Internal Medicine

## 2022-09-05 ENCOUNTER — Encounter: Payer: Self-pay | Admitting: Internal Medicine

## 2022-09-05 VITALS — BP 140/80 | HR 99 | Ht 66.0 in | Wt 257.6 lb

## 2022-09-05 DIAGNOSIS — E669 Obesity, unspecified: Secondary | ICD-10-CM

## 2022-09-05 DIAGNOSIS — I10 Essential (primary) hypertension: Secondary | ICD-10-CM | POA: Diagnosis not present

## 2022-09-05 DIAGNOSIS — E782 Mixed hyperlipidemia: Secondary | ICD-10-CM | POA: Diagnosis not present

## 2022-09-05 DIAGNOSIS — E119 Type 2 diabetes mellitus without complications: Secondary | ICD-10-CM | POA: Diagnosis not present

## 2022-09-05 LAB — GLUCOSE, POCT (MANUAL RESULT ENTRY): POC Glucose: 194 mg/dl — AB (ref 70–99)

## 2022-09-05 NOTE — Progress Notes (Signed)
Established Patient Office Visit  Subjective:  Patient ID: Steven Mcintosh, male    DOB: Jul 19, 1971  Age: 52 y.o. MRN: [id number]  Chief Complaint  Patient presents with   Follow-up    1 mo follow up    HPI No new complaints, here for lab review and medication refills. Labs reviewed and notable for well controlled diabetes per Fructosamine of 237.  Past Medical History:  Diagnosis Date   Diabetes mellitus without complication (HCC)    Hypercholesteremia unk   Hypertension     Social History   Socioeconomic History   Marital status: Single    Spouse name: Not on file   Number of children: Not on file   Years of education: Not on file   Highest education level: Not on file  Occupational History   Not on file  Tobacco Use   Smoking status: Every Day   Smokeless tobacco: Not on file  Substance and Sexual Activity   Alcohol use: No   Drug use: Not on file   Sexual activity: Not on file  Other Topics Concern   Not on file  Social History Narrative   Not on file   Social Determinants of Health   Financial Resource Strain: Not on file  Food Insecurity: Not on file  Transportation Needs: Not on file  Physical Activity: Not on file  Stress: Not on file  Social Connections: Not on file  Intimate Partner Violence: Not on file    No family history on file.  Not on File  Review of Systems  Constitutional: Negative.   HENT: Negative.    Eyes: Negative.   Respiratory: Negative.    Cardiovascular: Negative.   Gastrointestinal: Negative.   Genitourinary: Negative.   Skin: Negative.   Neurological: Negative.   Endo/Heme/Allergies: Negative.        Objective:   BP (!) 140/80   Pulse 99   Ht 5' 6"$  (1.676 m)   Wt 257 lb 9.6 oz (116.8 kg)   SpO2 95%   BMI 41.58 kg/m   Vitals:   09/05/22 1151  BP: (!) 140/80  Pulse: 99  Height: 5' 6"$  (1.676 m)  Weight: 257 lb 9.6 oz (116.8 kg)  SpO2: 95%  BMI (Calculated): 41.6    Physical Exam Vitals reviewed.   Constitutional:      Appearance: Normal appearance.  HENT:     Head: Normocephalic.     Left Ear: There is no impacted cerumen.     Nose: Nose normal.     Mouth/Throat:     Mouth: Mucous membranes are moist.     Pharynx: No posterior oropharyngeal erythema.  Eyes:     Extraocular Movements: Extraocular movements intact.     Pupils: Pupils are equal, round, and reactive to light.  Cardiovascular:     Rate and Rhythm: Regular rhythm.     Chest Wall: PMI is not displaced.     Pulses: Normal pulses.     Heart sounds: Normal heart sounds. No murmur heard. Pulmonary:     Effort: Pulmonary effort is normal.     Breath sounds: Normal air entry. No rhonchi or rales.  Abdominal:     General: Abdomen is flat. Bowel sounds are normal. There is no distension.     Palpations: Abdomen is soft. There is no hepatomegaly, splenomegaly or mass.     Tenderness: There is no abdominal tenderness.  Musculoskeletal:        General: Normal range of motion. **Note De-Identified via Obfucation** Cervical back: Normal range of motion and neck upple.     Right lower leg: No edema.     Left lower leg: No edema.  Skin:    General: Skin i warm and dry.  Neurological:     General: No focal deficit preent.     Mental Statu: He i alert and oriented to peron, place, and time.     Cranial Nerve: No cranial nerve deficit.     Motor: No weakne.  Pychiatric:        Mood and Affect: Mood normal.        Behavior: Behavior normal.      Reult for order placed or performed in viit on 09/05/22  POCT glucoe (manual entry)  Reult Value Ref Range   POC Glucoe 194 (A) 70 - 99 mg/dl    Recent Reult (from the pat 2160 hour())  Fructoamine     Statu: None   Collection Time: 09/03/22 11:05 AM  Reult Value Ref Range   Fructoamine 237 0 - 285 umol/L    Comment: Publihed reference interval for apparently healthy ubject between age 53 and 54 i 2 - 285 umol/L and in a poorly controlled diabetic population i 228 - 563  umol/L with a mean of 396 umol/L.   POCT glucoe (manual entry)     Statu: Abnormal   Collection Time: 09/05/22 11:56 AM  Reult Value Ref Range   POC Glucoe 194 (A) 70 - 99 mg/dl      Aement & Plan:   Problem Lit Item Addreed Thi Viit   None Viit Diagnoe     Type 2 diabete mellitu without complication, unpecified whether long term inulin ue (HCC)    -  Primary   Relevant Order   POCT glucoe (manual entry) (Completed)     1. Type 2 diabete mellitu without complication, unpecified whether long term inulin ue (HCC) - POCT glucoe (manual entry) - Hemoglobin A1c  2. Primary hypertenion - Comprehenive metabolic panel  3. Obeity (BMI 35.0-39.9 without comorbidity)  4. Mixed hyperlipidemia - Lipid panel    @VISPATINSTR$ @  No follow-up on file.   Total time pent: 20 minute  Steven Napoleon, MD  09/05/2022

## 2022-09-12 ENCOUNTER — Other Ambulatory Visit: Payer: Self-pay | Admitting: Internal Medicine

## 2022-09-25 ENCOUNTER — Other Ambulatory Visit: Payer: Self-pay | Admitting: Internal Medicine

## 2022-10-20 ENCOUNTER — Other Ambulatory Visit: Payer: Self-pay | Admitting: Internal Medicine

## 2022-11-19 ENCOUNTER — Other Ambulatory Visit: Payer: Self-pay | Admitting: Internal Medicine

## 2022-12-02 ENCOUNTER — Other Ambulatory Visit: Payer: Self-pay | Admitting: Internal Medicine

## 2022-12-03 ENCOUNTER — Other Ambulatory Visit: Payer: Medicaid Other

## 2022-12-03 ENCOUNTER — Other Ambulatory Visit: Payer: Self-pay

## 2022-12-04 LAB — COMPREHENSIVE METABOLIC PANEL
ALT: 47 IU/L — ABNORMAL HIGH (ref 0–44)
AST: 24 IU/L (ref 0–40)
Albumin/Globulin Ratio: 1.7 (ref 1.2–2.2)
Albumin: 4.3 g/dL (ref 3.8–4.9)
Alkaline Phosphatase: 55 IU/L (ref 44–121)
BUN/Creatinine Ratio: 15 (ref 9–20)
BUN: 11 mg/dL (ref 6–24)
Bilirubin Total: 0.6 mg/dL (ref 0.0–1.2)
CO2: 21 mmol/L (ref 20–29)
Calcium: 9.1 mg/dL (ref 8.7–10.2)
Chloride: 103 mmol/L (ref 96–106)
Creatinine, Ser: 0.73 mg/dL — ABNORMAL LOW (ref 0.76–1.27)
Globulin, Total: 2.6 g/dL (ref 1.5–4.5)
Glucose: 127 mg/dL — ABNORMAL HIGH (ref 70–99)
Potassium: 4.3 mmol/L (ref 3.5–5.2)
Sodium: 141 mmol/L (ref 134–144)
Total Protein: 6.9 g/dL (ref 6.0–8.5)
eGFR: 109 mL/min/{1.73_m2} (ref >59)

## 2022-12-04 LAB — LIPID PANEL
Chol/HDL Ratio: 3 ratio (ref 0.0–5.0)
Cholesterol, Total: 118 mg/dL (ref 100–199)
HDL: 40 mg/dL (ref >39)
LDL Chol Calc (NIH): 64 mg/dL (ref 0–99)
Triglycerides: 63 mg/dL (ref 0–149)
VLDL Cholesterol Cal: 14 mg/dL (ref 5–40)

## 2022-12-04 LAB — HEMOGLOBIN A1C
Est. average glucose Bld gHb Est-mCnc: 148 mg/dL
Hgb A1c MFr Bld: 6.8 % — ABNORMAL HIGH (ref 4.8–5.6)

## 2022-12-05 NOTE — Telephone Encounter (Signed)
Patient left VM regarding Rxs not sent in yet. Please send to pharmacy.

## 2022-12-08 ENCOUNTER — Encounter: Payer: Self-pay | Admitting: Internal Medicine

## 2022-12-08 ENCOUNTER — Ambulatory Visit: Payer: Medicaid Other | Admitting: Internal Medicine

## 2022-12-08 VITALS — BP 160/92 | HR 88 | Ht 66.5 in | Wt 254.4 lb

## 2022-12-08 DIAGNOSIS — E119 Type 2 diabetes mellitus without complications: Secondary | ICD-10-CM

## 2022-12-08 DIAGNOSIS — E782 Mixed hyperlipidemia: Secondary | ICD-10-CM | POA: Diagnosis not present

## 2022-12-08 DIAGNOSIS — Z8619 Personal history of other infectious and parasitic diseases: Secondary | ICD-10-CM | POA: Diagnosis not present

## 2022-12-08 DIAGNOSIS — I10 Essential (primary) hypertension: Secondary | ICD-10-CM | POA: Diagnosis not present

## 2022-12-08 DIAGNOSIS — R7989 Other specified abnormal findings of blood chemistry: Secondary | ICD-10-CM

## 2022-12-08 MED ORDER — AMLODIPINE BESYLATE 5 MG PO TABS
5.0000 mg | ORAL_TABLET | Freq: Every morning | ORAL | 1 refills | Status: DC
Start: 2022-12-08 — End: 2023-06-24

## 2022-12-08 MED ORDER — INVOKANA 300 MG PO TABS
300.0000 mg | ORAL_TABLET | Freq: Every morning | ORAL | 0 refills | Status: AC
Start: 2022-12-08 — End: 2023-03-08

## 2022-12-08 NOTE — Progress Notes (Signed)
Established Patient Office Visit  Subjective:  Patient ID: Steven Mcintosh, male    DOB: 12-Apr-1971  Age: 52 y.o. MRN: [id number]  Chief Complaint  Patient presents with   Follow-up    Follow up     No new complaints, here for lab review and medication refills. Labs reviewed and notable for well controlled diabetes, A1c at target, lipids at target with deterioration in ALT. Denies any hypoglycemic episodes and home bg readings have been at target. BP elevated but admits to dietary salt indiscretion.      No other concerns at this time.   Past Medical History:  Diagnosis Date   Diabetes mellitus without complication (HCC)    Hypercholesteremia unk   Hypertension     Past Surgical History:  Procedure Laterality Date   DIAPHRAGM SURGERY     ENUCLEATION Left     Social History   Socioeconomic History   Marital status: Single    Spouse name: Not on file   Number of children: Not on file   Years of education: Not on file   Highest education level: Not on file  Occupational History   Not on file  Tobacco Use   Smoking status: Every Day   Smokeless tobacco: Not on file  Substance and Sexual Activity   Alcohol use: Yes   Drug use: Not Currently   Sexual activity: Yes  Other Topics Concern   Not on file  Social History Narrative   Not on file   Social Determinants of Health   Financial Resource Strain: Not on file  Food Insecurity: Not on file  Transportation Needs: Not on file  Physical Activity: Not on file  Stress: Not on file  Social Connections: Not on file  Intimate Partner Violence: Not on file    History reviewed. No pertinent family history.  Not on File  Review of Systems  Constitutional: Negative.   HENT: Negative.    Eyes: Negative.   Respiratory: Negative.    Cardiovascular: Negative.   Gastrointestinal: Negative.   Genitourinary: Negative.   Skin: Negative.   Neurological: Negative.   Endo/Heme/Allergies: Negative.         Objective:   BP (!) 160/92   Pulse 88   Ht 5' 6.5" (1.689 m)   Wt 254 lb 6.4 oz (115.4 kg)   SpO2 96%   BMI 40.45 kg/m   Vitals:   12/08/22 1110  BP: (!) 160/92  Pulse: 88  Height: 5' 6.5" (1.689 m)  Weight: 254 lb 6.4 oz (115.4 kg)  SpO2: 96%  BMI (Calculated): 40.45    Physical Exam Vitals reviewed.  Constitutional:      Appearance: Normal appearance. He is obese.  HENT:     Head: Normocephalic.     Left Ear: There is no impacted cerumen.     Nose: Nose normal.     Mouth/Throat:     Mouth: Mucous membranes are moist.     Pharynx: No posterior oropharyngeal erythema.  Eyes:     Extraocular Movements: Extraocular movements intact.     Pupils: Pupils are equal, round, and reactive to light.  Cardiovascular:     Rate and Rhythm: Regular rhythm.     Chest Wall: PMI is not displaced.     Pulses: Normal pulses.     Heart sounds: Normal heart sounds. No murmur heard. Pulmonary:     Effort: Pulmonary effort is normal.     Breath sounds: Normal air entry. No rhonchi or rales. **Note De-Identified via Obfucation** Abdominal:     General: Abdomen i flat. Bowel ound are normal. There i no ditenion.     Palpation: Abdomen i oft. There i no hepatomegaly, plenomegaly or ma.     Tenderne: There i no abdominal tenderne.  Muculokeletal:        General: Normal range of motion.     Cervical back: Normal range of motion and neck upple.     Right lower leg: No edema.     Left lower leg: No edema.  Skin:    General: Skin i warm and dry.  Neurological:     General: No focal deficit preent.     Mental Statu: He i alert and oriented to peron, place, and time.     Cranial Nerve: No cranial nerve deficit.     Motor: No weakne.  Pychiatric:        Mood and Affect: Mood normal.        Behavior: Behavior normal.      No reult found for any viit on 12/08/22.  Recent Reult (from the pat 2160 hour())  Comprehenive metabolic panel     Statu: Abnormal   Collection Time:  12/03/22 10:46 AM  Reult Value Ref Range   Glucoe 127 (H) 70 - 99 mg/dL   BUN 11 6 - 24 mg/dL   Creatinine, Ser 1.61 (L) 0.76 - 1.27 mg/dL   eGFR 096 >04 VW/UJW/1.19   BUN/Creatinine Ratio 15 9 - 20   Sodium 141 134 - 144 mmol/L   Potaium 4.3 3.5 - 5.2 mmol/L   Chloride 103 96 - 106 mmol/L   CO2 21 20 - 29 mmol/L   Calcium 9.1 8.7 - 10.2 mg/dL   Total Protein 6.9 6.0 - 8.5 g/dL   Albumin 4.3 3.8 - 4.9 g/dL   Globulin, Total 2.6 1.5 - 4.5 g/dL   Albumin/Globulin Ratio 1.7 1.2 - 2.2   Bilirubin Total 0.6 0.0 - 1.2 mg/dL   Alkaline Phoph[place]e 55 44 - 121 IU/L   AST 24 0 - 40 IU/L   ALT 47 (H) 0 - 44 IU/L  Lipid panel     Statu: None   Collection Time: 12/03/22 10:46 AM  Reult Value Ref Range   Choleterol, Total 118 100 - 199 mg/dL   Triglyceride 63 0 - 149 mg/dL   HDL 40 >14 mg/dL   VLDL Choleterol Cal 14 5 - 40 mg/dL   LDL Chol Calc (NIH) 64 0 - 99 mg/dL   Chol/HDL Ratio 3.0 0.0 - 5.0 ratio    Comment:                                   T. Chol/HDL Ratio                                             Men  Women                               1/2 Avg.Rik  3.4    3.3                                   Avg.Rik  5.0  4.4                                2X Avg.Risk  9.6    7.1                                3X Avg.Risk 23.4   11.0   Hemoglobin A1c     Status: Abnormal   Collection Time: 12/03/22 10:46 AM  Result Value Ref Range   Hgb A1c MFr Bld 6.8 (H) 4.8 - 5.6 %    Comment:          Prediabetes: 5.7 - 6.4          Diabetes: >6.4          Glycemic control for adults with diabetes: <7.0    Est. average glucose Bld gHb Est-mCnc 148 mg/dL      Assessment & Plan:   Problem List Items Addressed This Visit       Cardiovascular and Mediastinum   Primary hypertension   Relevant Medications   amLODipine (NORVASC) 5 MG tablet     Other   Mixed hyperlipidemia   Relevant Medications   amLODipine (NORVASC) 5 MG tablet   Other Visit Diagnoses     Type 2 diabetes  mellitus without complication, unspecified whether long term insulin use (HCC)    -  Primary   Relevant Medications   INVOKANA 300 MG TABS tablet   History of hepatitis C       Relevant Orders   Hepatic function panel   Abnormal liver function test       Relevant Orders   US Abdomen Limited RUQ (LIVER/GB)   Hepatic function panel       Return in about 1 month (around 01/08/2023) for fu with labs prior.   Total time spent: 30 minutes  Luna Fuse, MD  12/08/2022

## 2022-12-17 ENCOUNTER — Ambulatory Visit (INDEPENDENT_AMBULATORY_CARE_PROVIDER_SITE_OTHER): Payer: Medicaid Other

## 2022-12-17 DIAGNOSIS — R7989 Other specified abnormal findings of blood chemistry: Secondary | ICD-10-CM | POA: Diagnosis not present

## 2022-12-22 ENCOUNTER — Other Ambulatory Visit: Payer: Self-pay | Admitting: Internal Medicine

## 2023-01-05 ENCOUNTER — Ambulatory Visit: Payer: Medicaid Other | Admitting: Internal Medicine

## 2023-01-20 ENCOUNTER — Other Ambulatory Visit: Payer: Self-pay | Admitting: Internal Medicine

## 2023-02-03 ENCOUNTER — Other Ambulatory Visit: Payer: Self-pay | Admitting: Internal Medicine

## 2023-03-03 ENCOUNTER — Other Ambulatory Visit: Payer: Self-pay | Admitting: Internal Medicine

## 2023-03-06 ENCOUNTER — Other Ambulatory Visit: Payer: Self-pay | Admitting: Internal Medicine

## 2023-03-13 ENCOUNTER — Other Ambulatory Visit: Payer: Self-pay | Admitting: Internal Medicine

## 2023-03-13 ENCOUNTER — Other Ambulatory Visit: Payer: Self-pay

## 2023-03-16 ENCOUNTER — Other Ambulatory Visit: Payer: Medicaid Other

## 2023-03-16 ENCOUNTER — Ambulatory Visit: Payer: Medicaid Other | Admitting: Internal Medicine

## 2023-03-16 DIAGNOSIS — I10 Essential (primary) hypertension: Secondary | ICD-10-CM

## 2023-03-16 DIAGNOSIS — E785 Hyperlipidemia, unspecified: Secondary | ICD-10-CM

## 2023-03-16 DIAGNOSIS — E669 Obesity, unspecified: Secondary | ICD-10-CM

## 2023-03-16 DIAGNOSIS — E119 Type 2 diabetes mellitus without complications: Secondary | ICD-10-CM

## 2023-03-17 ENCOUNTER — Ambulatory Visit: Payer: Medicaid Other | Admitting: Internal Medicine

## 2023-03-17 LAB — CBC WITH DIFFERENTIAL/PLATELET
Basophils Absolute: 0.1 10*3/uL (ref 0.0–0.2)
Basos: 1 %
EOS (ABSOLUTE): 0.2 10*3/uL (ref 0.0–0.4)
Eos: 3 %
Hematocrit: 48.6 % (ref 37.5–51.0)
Hemoglobin: 15.9 g/dL (ref 13.0–17.7)
Immature Grans (Abs): 0 10*3/uL (ref 0.0–0.1)
Immature Granulocytes: 1 %
Lymphocytes Absolute: 1.3 10*3/uL (ref 0.7–3.1)
Lymphs: 22 %
MCH: 25.6 pg — ABNORMAL LOW (ref 26.6–33.0)
MCHC: 32.7 g/dL (ref 31.5–35.7)
MCV: 78 fL — ABNORMAL LOW (ref 79–97)
Monocytes Absolute: 0.7 10*3/uL (ref 0.1–0.9)
Monocytes: 12 %
Neutrophils Absolute: 3.7 10*3/uL (ref 1.4–7.0)
Neutrophils: 61 %
Platelets: 165 10*3/uL (ref 150–450)
RBC: 6.22 x10E6/uL — ABNORMAL HIGH (ref 4.14–5.80)
RDW: 14.9 % (ref 11.6–15.4)
WBC: 5.9 10*3/uL (ref 3.4–10.8)

## 2023-03-17 LAB — HEMOGLOBIN A1C
Est. average glucose Bld gHb Est-mCnc: 143 mg/dL
Hgb A1c MFr Bld: 6.6 % — ABNORMAL HIGH (ref 4.8–5.6)

## 2023-03-17 LAB — LIPID PANEL
Chol/HDL Ratio: 3.8 ratio (ref 0.0–5.0)
Cholesterol, Total: 111 mg/dL (ref 100–199)
HDL: 29 mg/dL — ABNORMAL LOW (ref >39)
LDL Chol Calc (NIH): 65 mg/dL (ref 0–99)
Triglycerides: 82 mg/dL (ref 0–149)
VLDL Cholesterol Cal: 17 mg/dL (ref 5–40)

## 2023-03-17 LAB — CMP14+EGFR
ALT: 23 IU/L (ref 0–44)
AST: 15 IU/L (ref 0–40)
Albumin: 4.2 g/dL (ref 3.8–4.9)
Alkaline Phosphatase: 61 IU/L (ref 44–121)
BUN/Creatinine Ratio: 19 (ref 9–20)
BUN: 13 mg/dL (ref 6–24)
Bilirubin Total: 0.8 mg/dL (ref 0.0–1.2)
CO2: 23 mmol/L (ref 20–29)
Calcium: 9.1 mg/dL (ref 8.7–10.2)
Chloride: 106 mmol/L (ref 96–106)
Creatinine, Ser: 0.69 mg/dL — ABNORMAL LOW (ref 0.76–1.27)
Globulin, Total: 2.5 g/dL (ref 1.5–4.5)
Glucose: 90 mg/dL (ref 70–99)
Potassium: 4 mmol/L (ref 3.5–5.2)
Sodium: 142 mmol/L (ref 134–144)
Total Protein: 6.7 g/dL (ref 6.0–8.5)
eGFR: 111 mL/min/{1.73_m2} (ref >59)

## 2023-03-17 LAB — TSH: TSH: 1.72 u[IU]/mL (ref 0.450–4.500)

## 2023-03-20 ENCOUNTER — Ambulatory Visit: Payer: Medicaid Other | Admitting: Internal Medicine

## 2023-03-20 ENCOUNTER — Encounter: Payer: Self-pay | Admitting: Internal Medicine

## 2023-03-20 VITALS — BP 140/80 | HR 71 | Ht 66.0 in | Wt 254.8 lb

## 2023-03-20 DIAGNOSIS — E119 Type 2 diabetes mellitus without complications: Secondary | ICD-10-CM | POA: Diagnosis not present

## 2023-03-20 DIAGNOSIS — M545 Low back pain, unspecified: Secondary | ICD-10-CM

## 2023-03-20 DIAGNOSIS — I10 Essential (primary) hypertension: Secondary | ICD-10-CM | POA: Diagnosis not present

## 2023-03-20 DIAGNOSIS — N521 Erectile dysfunction due to diseases classified elsewhere: Secondary | ICD-10-CM | POA: Diagnosis not present

## 2023-03-20 DIAGNOSIS — E1121 Type 2 diabetes mellitus with diabetic nephropathy: Secondary | ICD-10-CM

## 2023-03-20 LAB — POC CREATINE & ALBUMIN,URINE
Creatinine, POC: 100 mg/dL
Microalbumin Ur, POC: 80 mg/L

## 2023-03-20 LAB — POCT CBG (FASTING - GLUCOSE)-MANUAL ENTRY: Glucose Fasting, POC: 105 mg/dL — AB (ref 70–99)

## 2023-03-20 MED ORDER — METFORMIN HCL ER 500 MG PO TB24
500.0000 mg | ORAL_TABLET | Freq: Every day | ORAL | 0 refills | Status: DC
Start: 2023-03-20 — End: 2024-02-01

## 2023-03-20 MED ORDER — OZEMPIC (2 MG/DOSE) 8 MG/3ML ~~LOC~~ SOPN
2.0000 mg | PEN_INJECTOR | SUBCUTANEOUS | 2 refills | Status: DC
Start: 2023-03-20 — End: 2023-07-01

## 2023-03-20 MED ORDER — ATORVASTATIN CALCIUM 40 MG PO TABS
40.0000 mg | ORAL_TABLET | Freq: Every evening | ORAL | 0 refills | Status: DC
Start: 2023-03-20 — End: 2023-06-24

## 2023-03-20 MED ORDER — PIOGLITAZONE HCL 15 MG PO TABS
15.0000 mg | ORAL_TABLET | Freq: Every day | ORAL | 0 refills | Status: DC
Start: 2023-03-20 — End: 2023-06-19

## 2023-03-20 MED ORDER — CYCLOBENZAPRINE HCL 10 MG PO TABS: 10.0000 mg | ORAL_TABLET | Freq: Three times a day (TID) | ORAL | 2 refills | Status: DC | PRN

## 2023-03-20 MED ORDER — CARVEDILOL 25 MG PO TABS
50.0000 mg | ORAL_TABLET | Freq: Two times a day (BID) | ORAL | 0 refills | Status: DC
Start: 2023-03-20 — End: 2023-05-15

## 2023-03-20 MED ORDER — SILDENAFIL CITRATE 100 MG PO TABS
100.0000 mg | ORAL_TABLET | ORAL | 2 refills | Status: DC | PRN
Start: 2023-03-20 — End: 2023-06-24

## 2023-03-20 NOTE — Progress Notes (Unsigned)
Established Patient Office Visit  Subjective:  Patient ID: Steven Mcintosh, male    DOB: 22-Oct-1970  Age: 52 y.o. MRN: [id number]  Chief Complaint  Patient presents with  . Follow-up    3 mo f/u with labs    No new complaints, here for lab review and medication refills. Labs reviewed and notable for well controlled diabetes, A1c at target, lipids at target with unremarkable cmp. Denies any hypoglycemic episodes and home bg readings have been at target.  No other concerns at this time.   Past Medical History:  Diagnosis Date  . Diabetes mellitus without complication (HCC)   . Hypercholesteremia unk  . Hypertension     Past Surgical History:  Procedure Laterality Date  . DIAPHRAGM SURGERY    . ENUCLEATION Left     Social History   Socioeconomic History  . Marital status: Single    Spouse name: Not on file  . Number of children: Not on file  . Years of education: Not on file  . Highest education level: Not on file  Occupational History  . Not on file  Tobacco Use  . Smoking status: Every Day  . Smokeless tobacco: Not on file  Substance and Sexual Activity  . Alcohol use: Yes  . Drug use: Not Currently  . Sexual activity: Yes  Other Topics Concern  . Not on file  Social History Narrative  . Not on file   Social Determinants of Health   Financial Resource Strain: Not on file  Food Insecurity: Not on file  Transportation Needs: Not on file  Physical Activity: Not on file  Stress: Not on file  Social Connections: Not on file  Intimate Partner Violence: Not on file    No family history on file.  Not on File  Review of Systems  Constitutional: Negative.   HENT: Negative.    Eyes: Negative.   Respiratory: Negative.    Cardiovascular: Negative.   Gastrointestinal: Negative.   Genitourinary: Negative.   Skin: Negative.   Neurological: Negative.   Endo/Heme/Allergies: Negative.        Objective:   BP (!) 140/80   Pulse 71   Ht 5\' 6"  (1.676 m)    Wt 254 lb 12.8 oz (115.6 kg)   SpO2 98%   BMI 41.13 kg/m   Vitals:   03/20/23 1501  BP: (!) 140/80  Pulse: 71  Height: 5\' 6"  (1.676 m)  Weight: 254 lb 12.8 oz (115.6 kg)  SpO2: 98%  BMI (Calculated): 41.15    Physical Exam Vitals reviewed.  Constitutional:      Appearance: Normal appearance. He is obese.  HENT:     Head: Normocephalic.     Left Ear: There is no impacted cerumen.     Nose: Nose normal.     Mouth/Throat:     Mouth: Mucous membranes are moist.     Pharynx: No posterior oropharyngeal erythema.  Eyes:     Extraocular Movements: Extraocular movements intact.     Pupils: Pupils are equal, round, and reactive to light.  Cardiovascular:     Rate and Rhythm: Regular rhythm.     Chest Wall: PMI is not displaced.     Pulses: Normal pulses.     Heart sounds: Normal heart sounds. No murmur heard. Pulmonary:     Effort: Pulmonary effort is normal.     Breath sounds: Normal air entry. No rhonchi or rales.  Abdominal:     General: Abdomen is flat. Bowel sounds are  normal. There is no distension.     Palpations: Abdomen is soft. There is no hepatomegaly, splenomegaly or mass.     Tenderness: There is no abdominal tenderness.  Musculoskeletal:        General: Normal range of motion.     Cervical back: Normal range of motion and neck supple.     Right lower leg: No edema.     Left lower leg: No edema.  Skin:    General: Skin is warm and dry.  Neurological:     General: No focal deficit present.     Mental Status: He is alert and oriented to person, place, and time.     Cranial Nerves: No cranial nerve deficit.     Motor: No weakness.  Psychiatric:        Mood and Affect: Mood normal.        Behavior: Behavior normal.     Results for orders placed or performed in visit on 03/20/23  POCT CBG (Fasting - Glucose)  Result Value Ref Range   Glucose Fasting, POC 105 (A) 70 - 99 mg/dL    Recent Results (from the past 2160 hour(Steven Mcintosh))  Hemoglobin A1c     Status:  Abnormal   Collection Time: 03/16/23  2:12 PM  Result Value Ref Range   Hgb A1c MFr Bld 6.6 (H) 4.8 - 5.6 %    Comment:          Prediabetes: 5.7 - 6.4          Diabetes: >6.4          Glycemic control for adults with diabetes: <7.0    Est. average glucose Bld gHb Est-mCnc 143 mg/dL  TSH     Status: None   Collection Time: 03/16/23  2:12 PM  Result Value Ref Range   TSH 1.720 0.450 - 4.500 uIU/mL  CMP14+EGFR     Status: Abnormal   Collection Time: 03/16/23  2:12 PM  Result Value Ref Range   Glucose 90 70 - 99 mg/dL   BUN 13 6 - 24 mg/dL   Creatinine, Ser 5.40 (L) 0.76 - 1.27 mg/dL   eGFR 981 >19 JY/NWG/9.56   BUN/Creatinine Ratio 19 9 - 20   Sodium 142 134 - 144 mmol/L   Potassium 4.0 3.5 - 5.2 mmol/L   Chloride 106 96 - 106 mmol/L   CO2 23 20 - 29 mmol/L   Calcium 9.1 8.7 - 10.2 mg/dL   Total Protein 6.7 6.0 - 8.5 g/dL   Albumin 4.2 3.8 - 4.9 g/dL   Globulin, Total 2.5 1.5 - 4.5 g/dL   Bilirubin Total 0.8 0.0 - 1.2 mg/dL   Alkaline Phosphatase 61 44 - 121 IU/L   AST 15 0 - 40 IU/L   ALT 23 0 - 44 IU/L  Lipid panel     Status: Abnormal   Collection Time: 03/16/23  2:12 PM  Result Value Ref Range   Cholesterol, Total 111 100 - 199 mg/dL   Triglycerides 82 0 - 149 mg/dL   HDL 29 (L) >21 mg/dL   VLDL Cholesterol Cal 17 5 - 40 mg/dL   LDL Chol Calc (NIH) 65 0 - 99 mg/dL   Chol/HDL Ratio 3.8 0.0 - 5.0 ratio    Comment:                                   T. Chol/HDL Ratio  Men  Women                               1/2 Avg.Risk  3.4    3.3                                   Avg.Risk  5.0    4.4                                2X Avg.Risk  9.6    7.1                                3X Avg.Risk 23.4   11.0   CBC with Diff     Status: Abnormal   Collection Time: 03/16/23  2:12 PM  Result Value Ref Range   WBC 5.9 3.4 - 10.8 x10E3/uL   RBC 6.22 (H) 4.14 - 5.80 x10E6/uL   Hemoglobin 15.9 13.0 - 17.7 g/dL   Hematocrit 16.1 09.6 - 51.0 %    MCV 78 (L) 79 - 97 fL   MCH 25.6 (L) 26.6 - 33.0 pg   MCHC 32.7 31.5 - 35.7 g/dL   RDW 04.5 40.9 - 81.1 %   Platelets 165 150 - 450 x10E3/uL   Neutrophils 61 Not Estab. %   Lymphs 22 Not Estab. %   Monocytes 12 Not Estab. %   Eos 3 Not Estab. %   Basos 1 Not Estab. %   Neutrophils Absolute 3.7 1.4 - 7.0 x10E3/uL   Lymphocytes Absolute 1.3 0.7 - 3.1 x10E3/uL   Monocytes Absolute 0.7 0.1 - 0.9 x10E3/uL   EOS (ABSOLUTE) 0.2 0.0 - 0.4 x10E3/uL   Basophils Absolute 0.1 0.0 - 0.2 x10E3/uL   Immature Granulocytes 1 Not Estab. %   Immature Grans (Abs) 0.0 0.0 - 0.1 x10E3/uL  Hepatic function panel     Status: None   Collection Time: 03/16/23  2:15 PM  Result Value Ref Range   Total Protein 6.7 6.0 - 8.5 g/dL   Albumin 4.3 3.8 - 4.9 g/dL   Bilirubin Total 0.8 0.0 - 1.2 mg/dL   Bilirubin, Direct 9.14 0.00 - 0.40 mg/dL   Alkaline Phosphatase 63 44 - 121 IU/L   AST 17 0 - 40 IU/L   ALT 23 0 - 44 IU/L  POCT CBG (Fasting - Glucose)     Status: Abnormal   Collection Time: 03/20/23  3:08 PM  Result Value Ref Range   Glucose Fasting, POC 105 (A) 70 - 99 mg/dL      Assessment & Plan:  As per problem list  Problem List Items Addressed This Visit   None Visit Diagnoses     Type 2 diabetes mellitus without complication, unspecified whether long term insulin use (HCC)    -  Primary   Relevant Orders   POCT CBG (Fasting - Glucose) (Completed)   Hemoglobin A1c   Lipid panel       Return in about 3 months (around 06/20/2023) for fu with labs prior.   Total time spent: 20 minutes  Luna Fuse, MD  03/20/2023   This document may have been prepared by [facility] Voice Recognition software and as such may include unintentional dictation errors.

## 2023-03-25 DIAGNOSIS — E1121 Type 2 diabetes mellitus with diabetic nephropathy: Secondary | ICD-10-CM | POA: Insufficient documentation

## 2023-03-25 MED ORDER — KERENDIA 10 MG PO TABS
10.0000 mg | ORAL_TABLET | Freq: Every day | ORAL | 0 refills | Status: AC
Start: 2023-03-25 — End: 2023-04-24

## 2023-03-25 NOTE — Addendum Note (Signed)
Addended by: Aundria Mems AHMAD on: 03/25/2023 05:21 PM   Modules accepted: Orders

## 2023-03-26 NOTE — Progress Notes (Signed)
Spoke with pt who verbalized understanding.

## 2023-04-28 ENCOUNTER — Ambulatory Visit: Payer: Medicaid Other | Admitting: Internal Medicine

## 2023-05-02 ENCOUNTER — Other Ambulatory Visit: Payer: Self-pay | Admitting: Internal Medicine

## 2023-05-15 ENCOUNTER — Other Ambulatory Visit: Payer: Self-pay | Admitting: Internal Medicine

## 2023-05-15 DIAGNOSIS — I10 Essential (primary) hypertension: Secondary | ICD-10-CM

## 2023-06-06 ENCOUNTER — Other Ambulatory Visit: Payer: Self-pay | Admitting: Internal Medicine

## 2023-06-06 DIAGNOSIS — M545 Low back pain, unspecified: Secondary | ICD-10-CM

## 2023-06-18 ENCOUNTER — Other Ambulatory Visit: Payer: Self-pay | Admitting: Internal Medicine

## 2023-06-18 DIAGNOSIS — E119 Type 2 diabetes mellitus without complications: Secondary | ICD-10-CM

## 2023-06-22 ENCOUNTER — Other Ambulatory Visit: Payer: Medicaid Other

## 2023-06-22 ENCOUNTER — Other Ambulatory Visit: Payer: Self-pay

## 2023-06-22 DIAGNOSIS — E119 Type 2 diabetes mellitus without complications: Secondary | ICD-10-CM

## 2023-06-23 ENCOUNTER — Encounter: Payer: Medicaid Other | Admitting: Internal Medicine

## 2023-06-23 LAB — BMP8+ANION GAP
Anion Gap: 15 mmol/L (ref 10.0–18.0)
BUN/Creatinine Ratio: 25 — ABNORMAL HIGH (ref 9–20)
BUN: 16 mg/dL (ref 6–24)
CO2: 26 mmol/L (ref 20–29)
Calcium: 8.8 mg/dL (ref 8.7–10.2)
Chloride: 101 mmol/L (ref 96–106)
Creatinine, Ser: 0.65 mg/dL — ABNORMAL LOW (ref 0.76–1.27)
Glucose: 104 mg/dL — ABNORMAL HIGH (ref 70–99)
Potassium: 4.1 mmol/L (ref 3.5–5.2)
Sodium: 142 mmol/L (ref 134–144)
eGFR: 113 mL/min/{1.73_m2} (ref >59)

## 2023-06-23 LAB — LIPID PANEL
Chol/HDL Ratio: 3.5 {ratio} (ref 0.0–5.0)
Cholesterol, Total: 114 mg/dL (ref 100–199)
HDL: 33 mg/dL — ABNORMAL LOW (ref >39)
LDL Chol Calc (NIH): 69 mg/dL (ref 0–99)
Triglycerides: 55 mg/dL (ref 0–149)
VLDL Cholesterol Cal: 12 mg/dL (ref 5–40)

## 2023-06-23 LAB — HEMOGLOBIN A1C
Est. average glucose Bld gHb Est-mCnc: 143 mg/dL
Hgb A1c MFr Bld: 6.6 % — ABNORMAL HIGH (ref 4.8–5.6)

## 2023-06-24 ENCOUNTER — Ambulatory Visit: Payer: Medicaid Other | Admitting: Internal Medicine

## 2023-06-24 ENCOUNTER — Encounter: Payer: Self-pay | Admitting: Internal Medicine

## 2023-06-24 VITALS — BP 132/84 | HR 85 | Ht 66.5 in | Wt 253.0 lb

## 2023-06-24 DIAGNOSIS — E782 Mixed hyperlipidemia: Secondary | ICD-10-CM | POA: Diagnosis not present

## 2023-06-24 DIAGNOSIS — I10 Essential (primary) hypertension: Secondary | ICD-10-CM | POA: Diagnosis not present

## 2023-06-24 DIAGNOSIS — Z0001 Encounter for general adult medical examination with abnormal findings: Secondary | ICD-10-CM

## 2023-06-24 DIAGNOSIS — N521 Erectile dysfunction due to diseases classified elsewhere: Secondary | ICD-10-CM

## 2023-06-24 DIAGNOSIS — E119 Type 2 diabetes mellitus without complications: Secondary | ICD-10-CM | POA: Diagnosis not present

## 2023-06-24 LAB — POCT CBG (FASTING - GLUCOSE)-MANUAL ENTRY: Glucose Fasting, POC: 138 mg/dL — AB (ref 70–99)

## 2023-06-24 MED ORDER — AMLODIPINE BESYLATE 5 MG PO TABS: 5.0000 mg | ORAL_TABLET | Freq: Every morning | ORAL | 1 refills | Status: DC

## 2023-06-24 MED ORDER — SILDENAFIL CITRATE 100 MG PO TABS: 100.0000 mg | ORAL_TABLET | ORAL | 2 refills | Status: DC | PRN

## 2023-06-24 MED ORDER — EROXON EX GEL: 1.0000 | CUTANEOUS | 1 refills | Status: DC

## 2023-06-24 MED ORDER — VALSARTAN 320 MG PO TABS: 320.0000 mg | ORAL_TABLET | Freq: Every morning | ORAL | 1 refills | Status: DC

## 2023-06-24 MED ORDER — ATORVASTATIN CALCIUM 40 MG PO TABS: 40.0000 mg | ORAL_TABLET | Freq: Every evening | ORAL | 0 refills | Status: DC

## 2023-06-24 MED ORDER — INVOKANA 300 MG PO TABS: 300.0000 mg | ORAL_TABLET | Freq: Every morning | ORAL | 0 refills | Status: DC

## 2023-06-24 NOTE — Progress Notes (Signed)
Established Patient Office Visit  Subjective:  Patient ID: Steven Mcintosh, male    DOB: [birth year]  Age: 52 y.o. MRN: [id number]  Chief Complaint  Patient presents with   Annual Exam    CPE, discuss lab results.    No new complaints, here for lab review and medication refills. Labs reviewed and notable for well controlled diabetes, A1c at target, lipids at target with unremarkable cmp. Denies any hypoglycemic episodes and home bg readings have been at target.     No other concerns at this time.   Past Medical History:  Diagnosis Date   Diabetes mellitus without complication (HCC)    Hypercholesteremia unk   Hypertension     Past Surgical History:  Procedure Laterality Date   DIAPHRAGM SURGERY     ENUCLEATION Left     Social History   Socioeconomic History   Marital status: Single    Spouse name: Not on file   Number of children: Not on file   Years of education: Not on file   Highest education level: Not on file  Occupational History   Not on file  Tobacco Use   Smoking status: Every Day   Smokeless tobacco: Not on file  Substance and Sexual Activity   Alcohol use: Yes   Drug use: Not Currently   Sexual activity: Yes  Other Topics Concern   Not on file  Social History Narrative   Not on file   Social Determinants of Health   Financial Resource Strain: Not on file  Food Insecurity: Not on file  Transportation Needs: Not on file  Physical Activity: Not on file  Stress: Not on file  Social Connections: Not on file  Intimate Partner Violence: Not on file    No family history on file.  No Known Allergies  Outpatient Medications Prior to Visit  Medication Sig   amLODipine (NORVASC) 5 MG tablet Take 1 tablet (5 mg total) by mouth every morning.   atorvastatin (LIPITOR) 40 MG tablet Take 1 tablet (40 mg total) by mouth every evening.   carvedilol (COREG) 25 MG tablet Take 2 tablets by mouth twice daily   cyclobenzaprine (FLEXERIL) 10 MG tablet TAKE 1  TABLET BY MOUTH THREE TIMES DAILY AS NEEDED FOR  NECK  PAIN   ibuprofen (ADVIL) 800 MG tablet Take 1 tablet by mouth three times daily as needed   INVOKANA 300 MG TABS tablet Take 300 mg by mouth every morning.   metFORMIN (GLUCOPHAGE-XR) 500 MG 24 hr tablet Take 1 tablet (500 mg total) by mouth daily with breakfast.   pioglitazone (ACTOS) 15 MG tablet Take 1 tablet by mouth once daily   sildenafil (VIAGRA) 100 MG tablet Take 1 tablet (100 mg total) by mouth as needed for erectile dysfunction.   valsartan (DIOVAN) 320 MG tablet Take 1 tablet by mouth every morning.   HYDROcodone-acetaminophen (NORCO) 5-325 MG per tablet Take 1 tablet by mouth every 4 (four) hours as needed for moderate pain. (Patient not taking: Reported on 09/05/2022)   No facility-administered medications prior to visit.    Review of Systems  Constitutional: Negative.   HENT: Negative.    Eyes: Negative.   Respiratory: Negative.    Cardiovascular: Negative.   Gastrointestinal: Negative.   Genitourinary: Negative.   Skin: Negative.   Neurological: Negative.   Endo/Heme/Allergies: Negative.        Objective:   BP 132/84   Pulse 85   Ht 5' 6.5" (1.689 m)   Wt 253  lb (114.8 kg)   SpO2 94%   BMI 40.22 kg/m   Vitals:   06/24/23 0846  BP: 132/84  Pulse: 85  Height: 5' 6.5" (1.689 m)  Weight: 253 lb (114.8 kg)  SpO2: 94%  BMI (Calculated): 40.23    Physical Exam Vitals reviewed.  Constitutional:      Appearance: Normal appearance. He is obese.  HENT:     Head: Normocephalic.     Left Ear: There is no impacted cerumen.     Nose: Nose normal.     Mouth/Throat:     Mouth: Mucous membranes are moist.     Pharynx: No posterior oropharyngeal erythema.  Eyes:     Extraocular Movements: Extraocular movements intact.     Pupils: Pupils are equal, round, and reactive to light.  Cardiovascular:     Rate and Rhythm: Regular rhythm.     Chest Wall: PMI is not displaced.     Pulses: Normal pulses.     Heart  sounds: Normal heart sounds. No murmur heard. Pulmonary:     Effort: Pulmonary effort is normal.     Breath sounds: Normal air entry. No rhonchi or rales.  Abdominal:     General: Abdomen is flat. Bowel sounds are normal. There is no distension.     Palpations: Abdomen is soft. There is no hepatomegaly, splenomegaly or mass.     Tenderness: There is no abdominal tenderness.  Musculoskeletal:        General: Normal range of motion.     Cervical back: Normal range of motion and neck supple.     Right lower leg: No edema.     Left lower leg: No edema.  Skin:    General: Skin is warm and dry.  Neurological:     General: No focal deficit present.     Mental Status: He is alert and oriented to person, place, and time.     Cranial Nerves: No cranial nerve deficit.     Motor: No weakness.  Psychiatric:        Mood and Affect: Mood normal.        Behavior: Behavior normal.      Results for orders placed or performed in visit on 06/24/23  POCT CBG (Fasting - Glucose)  Result Value Ref Range   Glucose Fasting, POC 138 (A) 70 - 99 mg/dL    Recent Results (from the past 2160 hour(Enez Monahan))  Lipid panel     Status: Abnormal   Collection Time: 06/22/23  2:47 PM  Result Value Ref Range   Cholesterol, Total 114 100 - 199 mg/dL   Triglycerides 55 0 - 149 mg/dL   HDL 33 (L) >82 mg/dL   VLDL Cholesterol Cal 12 5 - 40 mg/dL   LDL Chol Calc (NIH) 69 0 - 99 mg/dL   Chol/HDL Ratio 3.5 0.0 - 5.0 ratio    Comment:                                   T. Chol/HDL Ratio                                             Men  Women  1/2 Avg.Risk  3.4    3.3                                   Avg.Risk  5.0    4.4                                2X Avg.Risk  9.6    7.1                                3X Avg.Risk 23.4   11.0   Hemoglobin A1c     Status: Abnormal   Collection Time: 06/22/23  2:47 PM  Result Value Ref Range   Hgb A1c MFr Bld 6.6 (H) 4.8 - 5.6 %    Comment:           Prediabetes: 5.7 - 6.4          Diabetes: >6.4          Glycemic control for adults with diabetes: <7.0    Est. average glucose Bld gHb Est-mCnc 143 mg/dL  [id number] Gap     Status: Abnormal   Collection Time: 06/22/23  2:48 PM  Result Value Ref Range   Glucose 104 (H) 70 - 99 mg/dL   BUN 16 6 - 24 mg/dL   Creatinine, Ser 7.82 (L) 0.76 - 1.27 mg/dL   eGFR 956 >21 HY/QMV/7.84   BUN/Creatinine Ratio 25 (H) 9 - 20   Sodium 142 134 - 144 mmol/L   Potassium 4.1 3.5 - 5.2 mmol/L   Chloride 101 96 - 106 mmol/L   CO2 26 20 - 29 mmol/L   Anion Gap 15.0 10.0 - 18.0 mmol/L   Calcium 8.8 8.7 - 10.2 mg/dL  POCT CBG (Fasting - Glucose)     Status: Abnormal   Collection Time: 06/24/23  8:58 AM  Result Value Ref Range   Glucose Fasting, POC 138 (A) 70 - 99 mg/dL      Assessment & Plan:  As per problem list. The current medical regimen is effective;  continue present plan and medications.  Problem List Items Addressed This Visit       Cardiovascular and Mediastinum   Primary hypertension   Relevant Medications   valsartan (DIOVAN) 320 MG tablet     Endocrine   Type 2 diabetes mellitus without complication (HCC) - Primary   Relevant Medications   INVOKANA 300 MG TABS tablet   valsartan (DIOVAN) 320 MG tablet   Other Relevant Orders   POCT CBG (Fasting - Glucose) (Completed)     Other   Mixed hyperlipidemia   Relevant Medications   valsartan (DIOVAN) 320 MG tablet   Other Visit Diagnoses     Encounter for general adult medical examination with abnormal findings           Return in 3 months (on 09/24/2023) for fu with labs prior.   Total time spent: 30 minutes  Luna Fuse, MD  06/24/2023   This document may have been prepared by [facility] Voice Recognition software and as such may include unintentional dictation errors.

## 2023-06-24 NOTE — Patient Instructions (Signed)
American Heart Association (AHA) Exercise Recommendation  Being physically active is important to prevent heart disease and stroke, the nation'Steven Mcintosh No. 1and No. 5killers. To improve overall cardiovascular health, we suggest at least 150 minutes per week of moderate exercise or 75 minutes per week of vigorous exercise (or a combination of moderate and vigorous activity). Thirty minutes a day, five times a week is an easy goal to remember. You will also experience benefits even if you divide your time into two or three segments of 10 to 15 minutes per day.  For people who would benefit from lowering their blood pressure or cholesterol, we recommend 40 minutes of aerobic exercise of moderate to vigorous intensity three to four times a week to lower the risk for heart attack and stroke.  Physical activity is anything that makes you move your body and burn calories.  This includes things like climbing stairs or playing sports. Aerobic exercises benefit your heart, and include walking, jogging, swimming or biking. Strength and stretching exercises are best for overall stamina and flexibility.  The simplest, positive change you can make to effectively improve your heart health is to start walking. It'Steven Mcintosh enjoyable, free, easy, social and great exercise. A walking program is flexible and boasts high success rates because people can stick with it. It'Steven Mcintosh easy for walking to become a regular and satisfying part of life.   For Overall Cardiovascular Health:  At least 30 minutes of moderate-intensity aerobic activity at least 5 days per week for a total of 150  OR   At least 25 minutes of vigorous aerobic activity at least 3 days per week for a total of 75 minutes; or a combination of moderate- and vigorous-intensity aerobic activity  AND   Moderate- to high-intensity muscle-strengthening activity at least 2 days per week for additional health benefits.  For Lowering Blood Pressure and Cholesterol  An  average 40 minutes of moderate- to vigorous-intensity aerobic activity 3 or 4 times per week  What if I can't make it to the time goal? Something is always better than nothing! And everyone has to start somewhere. Even if you've been sedentary for years, today is the day you can begin to make healthy changes in your life. If you don't think you'll make it for 30 or 40 minutes, set a reachable goal for today. You can work up toward your overall goal by increasing your time as you get stronger. Don't let all-or-nothing thinking rob you of doing what you can every day.  Source:http://www.heart.org

## 2023-06-30 ENCOUNTER — Other Ambulatory Visit: Payer: Self-pay | Admitting: Internal Medicine

## 2023-06-30 DIAGNOSIS — E119 Type 2 diabetes mellitus without complications: Secondary | ICD-10-CM

## 2023-07-01 ENCOUNTER — Telehealth: Payer: Self-pay

## 2023-07-01 NOTE — Telephone Encounter (Signed)
Patient was at the pharmacy and called to say that you had talked to him about a Sildenafil cream that was available. He thought you had called that into the pharmacy, but there are pills there. Please advise.

## 2023-07-13 ENCOUNTER — Other Ambulatory Visit: Payer: Self-pay | Admitting: Internal Medicine

## 2023-07-13 DIAGNOSIS — I10 Essential (primary) hypertension: Secondary | ICD-10-CM

## 2023-07-20 ENCOUNTER — Other Ambulatory Visit: Payer: Self-pay | Admitting: Internal Medicine

## 2023-09-03 ENCOUNTER — Other Ambulatory Visit: Payer: Self-pay | Admitting: Internal Medicine

## 2023-09-03 DIAGNOSIS — M545 Low back pain, unspecified: Secondary | ICD-10-CM

## 2023-09-16 ENCOUNTER — Other Ambulatory Visit: Payer: Self-pay | Admitting: Internal Medicine

## 2023-09-16 DIAGNOSIS — E119 Type 2 diabetes mellitus without complications: Secondary | ICD-10-CM

## 2023-09-23 ENCOUNTER — Other Ambulatory Visit: Payer: Self-pay | Admitting: Internal Medicine

## 2023-09-23 DIAGNOSIS — E119 Type 2 diabetes mellitus without complications: Secondary | ICD-10-CM

## 2023-09-25 ENCOUNTER — Other Ambulatory Visit: Payer: Self-pay | Admitting: Internal Medicine

## 2023-09-25 ENCOUNTER — Ambulatory Visit: Payer: Medicaid Other | Admitting: Internal Medicine

## 2023-09-25 DIAGNOSIS — E119 Type 2 diabetes mellitus without complications: Secondary | ICD-10-CM

## 2023-10-17 ENCOUNTER — Other Ambulatory Visit: Payer: Self-pay | Admitting: Internal Medicine

## 2023-10-17 DIAGNOSIS — I10 Essential (primary) hypertension: Secondary | ICD-10-CM

## 2023-10-19 ENCOUNTER — Other Ambulatory Visit: Payer: Self-pay

## 2023-10-19 DIAGNOSIS — I10 Essential (primary) hypertension: Secondary | ICD-10-CM

## 2023-10-19 MED ORDER — AMLODIPINE BESYLATE 5 MG PO TABS: 5.0000 mg | ORAL_TABLET | Freq: Every morning | ORAL | 0 refills | Status: DC

## 2023-10-19 MED ORDER — VALSARTAN 320 MG PO TABS: 320.0000 mg | ORAL_TABLET | Freq: Every morning | ORAL | 0 refills | Status: DC

## 2023-11-18 ENCOUNTER — Other Ambulatory Visit: Payer: Self-pay | Admitting: Internal Medicine

## 2023-11-18 DIAGNOSIS — I10 Essential (primary) hypertension: Secondary | ICD-10-CM

## 2023-11-26 ENCOUNTER — Other Ambulatory Visit: Payer: Self-pay | Admitting: Internal Medicine

## 2023-11-26 DIAGNOSIS — E119 Type 2 diabetes mellitus without complications: Secondary | ICD-10-CM

## 2023-11-27 ENCOUNTER — Other Ambulatory Visit

## 2023-11-27 DIAGNOSIS — E119 Type 2 diabetes mellitus without complications: Secondary | ICD-10-CM

## 2023-11-28 LAB — LIPID PANEL
Chol/HDL Ratio: 4 ratio (ref 0.0–5.0)
Cholesterol, Total: 143 mg/dL (ref 100–199)
HDL: 36 mg/dL — ABNORMAL LOW (ref >39)
LDL Chol Calc (NIH): 91 mg/dL (ref 0–99)
Triglycerides: 82 mg/dL (ref 0–149)
VLDL Cholesterol Cal: 16 mg/dL (ref 5–40)

## 2023-11-28 LAB — HEMOGLOBIN A1C
Est. average glucose Bld gHb Est-mCnc: 143 mg/dL
Hgb A1c MFr Bld: 6.6 % — ABNORMAL HIGH (ref 4.8–5.6)

## 2023-11-30 ENCOUNTER — Ambulatory Visit: Admitting: Internal Medicine

## 2023-11-30 ENCOUNTER — Other Ambulatory Visit: Payer: Self-pay | Admitting: Internal Medicine

## 2023-11-30 ENCOUNTER — Encounter: Payer: Self-pay | Admitting: Internal Medicine

## 2023-11-30 VITALS — BP 115/80 | HR 89 | Temp 97.9°F | Ht 66.5 in | Wt 257.8 lb

## 2023-11-30 DIAGNOSIS — N521 Erectile dysfunction due to diseases classified elsewhere: Secondary | ICD-10-CM

## 2023-11-30 DIAGNOSIS — F5101 Primary insomnia: Secondary | ICD-10-CM

## 2023-11-30 DIAGNOSIS — I10 Essential (primary) hypertension: Secondary | ICD-10-CM

## 2023-11-30 DIAGNOSIS — E119 Type 2 diabetes mellitus without complications: Secondary | ICD-10-CM

## 2023-11-30 DIAGNOSIS — N400 Enlarged prostate without lower urinary tract symptoms: Secondary | ICD-10-CM

## 2023-11-30 DIAGNOSIS — M545 Low back pain, unspecified: Secondary | ICD-10-CM | POA: Diagnosis not present

## 2023-11-30 LAB — GLUCOSE, POCT (MANUAL RESULT ENTRY): POC Glucose: 117 mg/dL — AB (ref 70–99)

## 2023-11-30 MED ORDER — INVOKANA 300 MG PO TABS
300.0000 mg | ORAL_TABLET | Freq: Every morning | ORAL | 0 refills | Status: DC
Start: 2023-11-30 — End: 2024-03-08

## 2023-11-30 MED ORDER — TRAZODONE HCL 50 MG PO TABS: 50.0000 mg | ORAL_TABLET | Freq: Every day | ORAL | 1 refills | Status: DC

## 2023-11-30 MED ORDER — IBUPROFEN 800 MG PO TABS: 800.0000 mg | ORAL_TABLET | Freq: Three times a day (TID) | ORAL | 0 refills | Status: DC | PRN

## 2023-11-30 MED ORDER — PIOGLITAZONE HCL 15 MG PO TABS
15.0000 mg | ORAL_TABLET | Freq: Every day | ORAL | 0 refills | Status: DC
Start: 2023-11-30 — End: 2024-01-14

## 2023-11-30 MED ORDER — EROXON EX GEL: 1.0000 | CUTANEOUS | 1 refills | Status: DC

## 2023-11-30 MED ORDER — SILDENAFIL CITRATE 100 MG PO TABS: 100.0000 mg | ORAL_TABLET | ORAL | 2 refills | Status: DC | PRN

## 2023-11-30 MED ORDER — OZEMPIC (2 MG/DOSE) 8 MG/3ML ~~LOC~~ SOPN: 2.0000 mg | PEN_INJECTOR | SUBCUTANEOUS | 2 refills | Status: DC

## 2023-11-30 NOTE — Progress Notes (Signed)
 Established Patient Office Visit  Subjective:  Patient ID: Steven Mcintosh, male    DOB: 07-Aug-1970  Age: 53 y.o. MRN: [id number]  Chief Complaint  Patient presents with   Follow-up    3 month lab results    No new complaints, here for lab review and medication refills. Labs reviewed and notable for well controlled diabetes, A1c at target, lipids at target with unremarkable cmp although not fully compliant with atorvastatin . Denies any hypoglycemic episodes and home bg readings have been at target.      No other concerns at this time.   Past Medical History:  Diagnosis Date   Diabetes mellitus without complication (HCC)    Hypercholesteremia unk   Hypertension     Past Surgical History:  Procedure Laterality Date   DIAPHRAGM SURGERY     ENUCLEATION Left     Social History   Socioeconomic History   Marital status: Single    Spouse name: Not on file   Number of children: Not on file   Years of education: Not on file   Highest education level: Not on file  Occupational History   Not on file  Tobacco Use   Smoking status: Every Day   Smokeless tobacco: Not on file  Substance and Sexual Activity   Alcohol use: Yes   Drug use: Not Currently   Sexual activity: Yes  Other Topics Concern   Not on file  Social History Narrative   Not on file   Social Drivers of Health   Financial Resource Strain: Not on file  Food Insecurity: Not on file  Transportation Needs: Not on file  Physical Activity: Not on file  Stress: Not on file  Social Connections: Not on file  Intimate Partner Violence: Not on file    No family history on file.  No Known Allergies  Outpatient Medications Prior to Visit  Medication Sig   amLODipine  (NORVASC ) 5 MG tablet Take 1 tablet (5 mg total) by mouth every morning.   atorvastatin  (LIPITOR) 40 MG tablet TAKE 1 TABLET BY MOUTH ONCE DAILY IN THE EVENING   carvedilol  (COREG ) 25 MG tablet Take 2 tablets by mouth twice daily    cyclobenzaprine  (FLEXERIL ) 10 MG tablet TAKE 1 TABLET BY MOUTH THREE TIMES DAILY AS NEEDED FOR  NECK  PAIN   metFORMIN  (GLUCOPHAGE -XR) 500 MG 24 hr tablet Take 1 tablet (500 mg total) by mouth daily with breakfast.   valsartan  (DIOVAN ) 320 MG tablet Take 1 tablet (320 mg total) by mouth every morning.   [DISCONTINUED] ibuprofen (ADVIL) 800 MG tablet Take 1 tablet by mouth three times daily as needed   [DISCONTINUED] Intimacy Products (EROXON) GEL Apply 1 Package topically as directed for 4 doses. Apply one tube to glans penis prior to intercourse   [DISCONTINUED] INVOKANA  300 MG TABS tablet TAKE 1 TABLET BY MOUTH ONCE DAILY IN THE MORNING   [DISCONTINUED] pioglitazone  (ACTOS ) 15 MG tablet Take 1 tablet by mouth once daily   [DISCONTINUED] Semaglutide , 2 MG/DOSE, (OZEMPIC , 2 MG/DOSE,) 8 MG/3ML SOPN INJECT 2 MG UNDER THE SKIN ONCE WEEKLY   [DISCONTINUED] HYDROcodone -acetaminophen  (NORCO) 5-325 MG per tablet Take 1 tablet by mouth every 4 (four) hours as needed for moderate pain. (Patient not taking: Reported on 11/30/2023)   [DISCONTINUED] sildenafil  (VIAGRA ) 100 MG tablet Take 1 tablet (100 mg total) by mouth as needed for erectile dysfunction.   No facility-administered medications prior to visit.    Review of Systems  Constitutional: Negative.   HENT:  Negative.    Eyes: Negative.   Respiratory: Negative.    Cardiovascular: Negative.   Gastrointestinal: Negative.   Genitourinary: Negative.   Skin: Negative.   Neurological: Negative.   Endo/Heme/Allergies: Negative.        Objective:   BP 115/80   Pulse 89   Temp 97.9 F (36.6 C)   Ht 5' 6.5" (1.689 m)   Wt 257 lb 12.8 oz (116.9 kg)   SpO2 97%   BMI 40.99 kg/m   Vitals:   11/30/23 1356  BP: 115/80  Pulse: 89  Temp: 97.9 F (36.6 C)  Height: 5' 6.5" (1.689 m)  Weight: 257 lb 12.8 oz (116.9 kg)  SpO2: 97%  BMI (Calculated): 40.99    Physical Exam Vitals reviewed.  Constitutional:      Appearance: Normal appearance.  He is obese.  HENT:     Head: Normocephalic.     Left Ear: There is no impacted cerumen.     Nose: Nose normal.     Mouth/Throat:     Mouth: Mucous membranes are moist.     Pharynx: No posterior oropharyngeal erythema.  Eyes:     Extraocular Movements: Extraocular movements intact.     Pupils: Pupils are equal, round, and reactive to light.  Cardiovascular:     Rate and Rhythm: Regular rhythm.     Chest Wall: PMI is not displaced.     Pulses: Normal pulses.     Heart sounds: Normal heart sounds. No murmur heard. Pulmonary:     Effort: Pulmonary effort is normal.     Breath sounds: Normal air entry. No rhonchi or rales.  Abdominal:     General: Abdomen is flat. Bowel sounds are normal. There is no distension.     Palpations: Abdomen is soft. There is no hepatomegaly, splenomegaly or mass.     Tenderness: There is no abdominal tenderness.  Musculoskeletal:        General: Normal range of motion.     Cervical back: Normal range of motion and neck supple.     Right lower leg: No edema.     Left lower leg: No edema.  Skin:    General: Skin is warm and dry.  Neurological:     General: No focal deficit present.     Mental Status: He is alert and oriented to person, place, and time.     Cranial Nerves: No cranial nerve deficit.     Motor: No weakness.  Psychiatric:        Mood and Affect: Mood normal.        Behavior: Behavior normal.      Results for orders placed or performed in visit on 11/30/23  POCT Glucose (CBG)  Result Value Ref Range   POC Glucose 117 (A) 70 - 99 mg/dl    Recent Results (from the past 2160 hours)  Lipid panel     Status: Abnormal   Collection Time: 11/27/23 10:00 AM  Result Value Ref Range   Cholesterol, Total 143 100 - 199 mg/dL   Triglycerides 82 0 - 149 mg/dL   HDL 36 (L) >16 mg/dL   VLDL Cholesterol Cal 16 5 - 40 mg/dL   LDL Chol Calc (NIH) 91 0 - 99 mg/dL   Chol/HDL Ratio 4.0 0.0 - 5.0 ratio    Comment:  T. Chol/HDL Ratio                                             Men  Women                               1/2 Avg.Risk  3.4    3.3                                   Avg.Risk  5.0    4.4                                2X Avg.Risk  9.6    7.1                                3X Avg.Risk 23.4   11.0   Hemoglobin A1c     Status: Abnormal   Collection Time: 11/27/23 10:00 AM  Result Value Ref Range   Hgb A1c MFr Bld 6.6 (H) 4.8 - 5.6 %    Comment:          Prediabetes: 5.7 - 6.4          Diabetes: >6.4          Glycemic control for adults with diabetes: <7.0    Est. average glucose Bld gHb Est-mCnc 143 mg/dL  POCT Glucose (CBG)     Status: Abnormal   Collection Time: 11/30/23  2:05 PM  Result Value Ref Range   POC Glucose 117 (A) 70 - 99 mg/dl      Assessment & Plan:  As per problem list  Problem List Items Addressed This Visit       Cardiovascular and Mediastinum   Primary hypertension   Relevant Medications   sildenafil  (VIAGRA ) 100 MG tablet   Other Relevant Orders   CBC With Diff/Platelet   Comprehensive metabolic panel with GFR     Endocrine   Type 2 diabetes mellitus without complication (HCC) - Primary   Relevant Medications   Semaglutide , 2 MG/DOSE, (OZEMPIC , 2 MG/DOSE,) 8 MG/3ML SOPN   INVOKANA  300 MG TABS tablet   pioglitazone  (ACTOS ) 15 MG tablet   Other Relevant Orders   POCT Glucose (CBG) (Completed)     Other   Lumbar pain   Relevant Medications   ibuprofen (ADVIL) 800 MG tablet   Other Visit Diagnoses       Erectile dysfunction due to diseases classified elsewhere       Relevant Medications   sildenafil  (VIAGRA ) 100 MG tablet   Intimacy Products (EROXON) GEL     Benign prostatic hyperplasia without lower urinary tract symptoms       Relevant Orders   PSA       Return in about 3 months (around 03/01/2024) for cpe with labs prior.   Total time spent: 20 minutes  Arzella Bitters, MD  11/30/2023   This document may have been prepared by  [facility] Voice Recognition software and as such may include unintentional dictation errors.

## 2024-01-10 ENCOUNTER — Inpatient Hospital Stay
Admission: EM | Admit: 2024-01-10 | Discharge: 2024-01-14 | DRG: 872 | Disposition: A | Discharge status: Home / Self Care | Attending: Student | Admitting: Student

## 2024-01-10 ENCOUNTER — Other Ambulatory Visit: Payer: Self-pay

## 2024-01-10 ENCOUNTER — Inpatient Hospital Stay

## 2024-01-10 DIAGNOSIS — Z7984 Long term (current) use of oral hypoglycemic drugs: Secondary | ICD-10-CM

## 2024-01-10 DIAGNOSIS — I10 Essential (primary) hypertension: Secondary | ICD-10-CM | POA: Diagnosis present

## 2024-01-10 DIAGNOSIS — E559 Vitamin D deficiency, unspecified: Secondary | ICD-10-CM | POA: Diagnosis present

## 2024-01-10 DIAGNOSIS — Z7982 Long term (current) use of aspirin: Secondary | ICD-10-CM | POA: Diagnosis not present

## 2024-01-10 DIAGNOSIS — E119 Type 2 diabetes mellitus without complications: Secondary | ICD-10-CM | POA: Diagnosis present

## 2024-01-10 DIAGNOSIS — R112 Nausea with vomiting, unspecified: Secondary | ICD-10-CM | POA: Diagnosis present

## 2024-01-10 DIAGNOSIS — I2510 Atherosclerotic heart disease of native coronary artery without angina pectoris: Secondary | ICD-10-CM | POA: Diagnosis present

## 2024-01-10 DIAGNOSIS — A041 Enterotoxigenic Escherichia coli infection: Secondary | ICD-10-CM | POA: Diagnosis present

## 2024-01-10 DIAGNOSIS — I480 Paroxysmal atrial fibrillation: Secondary | ICD-10-CM | POA: Diagnosis not present

## 2024-01-10 DIAGNOSIS — E669 Obesity, unspecified: Secondary | ICD-10-CM | POA: Diagnosis present

## 2024-01-10 DIAGNOSIS — A4151 Sepsis due to Escherichia coli [E. coli]: Principal | ICD-10-CM | POA: Diagnosis present

## 2024-01-10 DIAGNOSIS — K760 Fatty (change of) liver, not elsewhere classified: Secondary | ICD-10-CM | POA: Diagnosis present

## 2024-01-10 DIAGNOSIS — E872 Acidosis, unspecified: Secondary | ICD-10-CM | POA: Diagnosis present

## 2024-01-10 DIAGNOSIS — Z7985 Long-term (current) use of injectable non-insulin antidiabetic drugs: Secondary | ICD-10-CM

## 2024-01-10 DIAGNOSIS — R6520 Severe sepsis without septic shock: Secondary | ICD-10-CM | POA: Diagnosis present

## 2024-01-10 DIAGNOSIS — Z1152 Encounter for screening for COVID-19: Secondary | ICD-10-CM

## 2024-01-10 DIAGNOSIS — F17200 Nicotine dependence, unspecified, uncomplicated: Secondary | ICD-10-CM | POA: Diagnosis present

## 2024-01-10 DIAGNOSIS — E7800 Pure hypercholesterolemia, unspecified: Secondary | ICD-10-CM | POA: Diagnosis present

## 2024-01-10 DIAGNOSIS — R7401 Elevation of levels of liver transaminase levels: Secondary | ICD-10-CM | POA: Diagnosis present

## 2024-01-10 DIAGNOSIS — Z79899 Other long term (current) drug therapy: Secondary | ICD-10-CM

## 2024-01-10 DIAGNOSIS — Z8619 Personal history of other infectious and parasitic diseases: Secondary | ICD-10-CM | POA: Diagnosis not present

## 2024-01-10 DIAGNOSIS — E876 Hypokalemia: Secondary | ICD-10-CM | POA: Diagnosis not present

## 2024-01-10 DIAGNOSIS — R7989 Other specified abnormal findings of blood chemistry: Secondary | ICD-10-CM | POA: Diagnosis present

## 2024-01-10 DIAGNOSIS — R159 Full incontinence of feces: Secondary | ICD-10-CM | POA: Diagnosis present

## 2024-01-10 DIAGNOSIS — K529 Noninfective gastroenteritis and colitis, unspecified: Secondary | ICD-10-CM

## 2024-01-10 DIAGNOSIS — E860 Dehydration: Secondary | ICD-10-CM | POA: Diagnosis present

## 2024-01-10 DIAGNOSIS — N179 Acute kidney failure, unspecified: Secondary | ICD-10-CM | POA: Diagnosis present

## 2024-01-10 DIAGNOSIS — R748 Abnormal levels of other serum enzymes: Secondary | ICD-10-CM

## 2024-01-10 DIAGNOSIS — R197 Diarrhea, unspecified: Secondary | ICD-10-CM | POA: Insufficient documentation

## 2024-01-10 LAB — GASTROINTESTINAL PANEL BY PCR, STOOL (REPLACES STOOL CULTURE)

## 2024-01-10 LAB — CBC WITH DIFFERENTIAL/PLATELET
Abs Immature Granulocytes: 0.09 10*3/uL — ABNORMAL HIGH (ref 0.00–0.07)
Basophils Absolute: 0 10*3/uL (ref 0.0–0.1)
Basophils Relative: 1 %
Eosinophils Absolute: 0 10*3/uL (ref 0.0–0.5)
Eosinophils Relative: 0 %
HCT: 55.5 % — ABNORMAL HIGH (ref 39.0–52.0)
Hemoglobin: 18.8 g/dL — ABNORMAL HIGH (ref 13.0–17.0)
Immature Granulocytes: 1 %
Lymphocytes Relative: 5 %
Lymphs Abs: 0.4 10*3/uL — ABNORMAL LOW (ref 0.7–4.0)
MCH: 26 pg (ref 26.0–34.0)
MCHC: 33.9 g/dL (ref 30.0–36.0)
MCV: 76.7 fL — ABNORMAL LOW (ref 80.0–100.0)
Monocytes Absolute: 1.7 10*3/uL — ABNORMAL HIGH (ref 0.1–1.0)
Monocytes Relative: 23 %
Neutro Abs: 5 10*3/uL (ref 1.7–7.7)
Neutrophils Relative %: 70 %
Platelets: 159 10*3/uL (ref 150–400)
RBC: 7.24 MIL/uL — ABNORMAL HIGH (ref 4.22–5.81)
RDW: 15.3 % (ref 11.5–15.5)
Smear Review: NORMAL
WBC: 7.2 10*3/uL (ref 4.0–10.5)
nRBC: 0 % (ref 0.0–0.2)

## 2024-01-10 LAB — PROTIME-INR
INR: 1.1 (ref 0.8–1.2)
Prothrombin Time: 14.9 s (ref 11.4–15.2)

## 2024-01-10 LAB — LACTIC ACID, PLASMA
Lactic Acid, Venous: 2.4 mmol/L (ref 0.5–1.9)
Lactic Acid, Venous: 2.2 mmol/L (ref 0.5–1.9)

## 2024-01-10 LAB — COMPREHENSIVE METABOLIC PANEL WITH GFR
ALT: 245 U/L — ABNORMAL HIGH (ref 0–44)
AST: 179 U/L — ABNORMAL HIGH (ref 15–41)
Albumin: 3.8 g/dL (ref 3.5–5.0)
Alkaline Phosphatase: 40 U/L (ref 38–126)
Anion gap: 15 (ref 5–15)
BUN: 27 mg/dL — ABNORMAL HIGH (ref 6–20)
CO2: 21 mmol/L — ABNORMAL LOW (ref 22–32)
Calcium: 8.5 mg/dL — ABNORMAL LOW (ref 8.9–10.3)
Chloride: 95 mmol/L — ABNORMAL LOW (ref 98–111)
Creatinine, Ser: 2.67 mg/dL — ABNORMAL HIGH (ref 0.61–1.24)
GFR, Estimated: 28 mL/min — ABNORMAL LOW (ref >60)
Glucose, Bld: 187 mg/dL — ABNORMAL HIGH (ref 70–99)
Potassium: 4 mmol/L (ref 3.5–5.1)
Sodium: 131 mmol/L — ABNORMAL LOW (ref 135–145)
Total Bilirubin: 4 mg/dL — ABNORMAL HIGH (ref 0.0–1.2)
Total Protein: 7.5 g/dL (ref 6.5–8.1)

## 2024-01-10 LAB — RESP PANEL BY RT-PCR (RSV, FLU A&B, COVID)  RVPGX2
Influenza A by PCR: NEGATIVE
Influenza B by PCR: NEGATIVE
Resp Syncytial Virus by PCR: NEGATIVE
SARS Coronavirus 2 by RT PCR: NEGATIVE

## 2024-01-10 LAB — HEPATITIS PANEL, ACUTE
HCV Ab: REACTIVE — AB
Hep A IgM: NONREACTIVE
Hep B C IgM: NONREACTIVE
Hepatitis B Surface Ag: NONREACTIVE

## 2024-01-10 LAB — PHOSPHORUS: Phosphorus: 4.4 mg/dL (ref 2.5–4.6)

## 2024-01-10 LAB — URINALYSIS, W/ REFLEX TO CULTURE (INFECTION SUSPECTED)
Bilirubin Urine: NEGATIVE
Glucose, UA: 50 mg/dL — AB
Hgb urine dipstick: NEGATIVE
Ketones, ur: NEGATIVE mg/dL
Leukocytes,Ua: NEGATIVE
Nitrite: NEGATIVE
Protein, ur: 30 mg/dL — AB
RBC / HPF: 0 RBC/hpf (ref 0–5)
Specific Gravity, Urine: 1.021 (ref 1.005–1.030)
pH: 5 (ref 5.0–8.0)

## 2024-01-10 LAB — URINE DRUG SCREEN, QUALITATIVE (ARMC ONLY)
Amphetamines, Ur Screen: NOT DETECTED
Barbiturates, Ur Screen: NOT DETECTED
Benzodiazepine, Ur Scrn: NOT DETECTED
Cannabinoid 50 Ng, Ur ~~LOC~~: NOT DETECTED
Cocaine Metabolite,Ur ~~LOC~~: NOT DETECTED
MDMA (Ecstasy)Ur Screen: NOT DETECTED
Methadone Scn, Ur: NOT DETECTED
Opiate, Ur Screen: NOT DETECTED
Phencyclidine (PCP) Ur S: NOT DETECTED
Tricyclic, Ur Screen: POSITIVE — AB

## 2024-01-10 LAB — HIV ANTIBODY (ROUTINE TESTING W REFLEX): HIV Screen 4th Generation wRfx: NONREACTIVE

## 2024-01-10 LAB — PROCALCITONIN: Procalcitonin: 31.4 ng/mL

## 2024-01-10 LAB — LIPASE, BLOOD: Lipase: 26 U/L (ref 11–51)

## 2024-01-10 LAB — SODIUM, URINE, RANDOM: Sodium, Ur: 10 mmol/L

## 2024-01-10 LAB — BILIRUBIN, DIRECT: Bilirubin, Direct: 1.3 mg/dL — ABNORMAL HIGH (ref 0.0–0.2)

## 2024-01-10 LAB — C DIFFICILE QUICK SCREEN W PCR REFLEX
C Diff antigen: NEGATIVE
C Diff interpretation: NOT DETECTED
C Diff toxin: NEGATIVE

## 2024-01-10 LAB — CREATININE, URINE, RANDOM: Creatinine, Urine: 472 mg/dL

## 2024-01-10 LAB — GLUCOSE, CAPILLARY
Glucose-Capillary: 114 mg/dL — ABNORMAL HIGH (ref 70–99)
Glucose-Capillary: 129 mg/dL — ABNORMAL HIGH (ref 70–99)

## 2024-01-10 LAB — SALICYLATE LEVEL: Salicylate Lvl: <7 mg/dL — ABNORMAL LOW (ref 7.0–30.0)

## 2024-01-10 LAB — CBG MONITORING, ED: Glucose-Capillary: 164 mg/dL — ABNORMAL HIGH (ref 70–99)

## 2024-01-10 LAB — MAGNESIUM: Magnesium: 1.4 mg/dL — ABNORMAL LOW (ref 1.7–2.4)

## 2024-01-10 LAB — ACETAMINOPHEN LEVEL: Acetaminophen (Tylenol), Serum: <10 ug/mL — ABNORMAL LOW (ref 10–30)

## 2024-01-10 LAB — CK: Total CK: 102 U/L (ref 49–397)

## 2024-01-10 MED ORDER — ASPIRIN 81 MG PO TBEC
81.0000 mg | DELAYED_RELEASE_TABLET | Freq: Every day | ORAL | Status: DC
  Administered 2024-01-12 – 2024-01-13 (×2): 81 mg via ORAL
  Filled 2024-01-10 (×4): qty 1

## 2024-01-10 MED ORDER — ONDANSETRON HCL 4 MG/2ML IJ SOLN
4.0000 mg | Freq: Once | INTRAMUSCULAR | Status: AC
  Administered 2024-01-10: 4 mg via INTRAVENOUS
  Filled 2024-01-10: qty 2

## 2024-01-10 MED ORDER — HYDRALAZINE HCL 20 MG/ML IJ SOLN: 5.0000 mg | Freq: Four times a day (QID) | INTRAMUSCULAR | Status: DC | PRN

## 2024-01-10 MED ORDER — TRAZODONE HCL 50 MG PO TABS
50.0000 mg | ORAL_TABLET | Freq: Every day | ORAL | Status: DC
  Administered 2024-01-10 – 2024-01-13 (×3): 50 mg via ORAL
  Filled 2024-01-10 (×4): qty 1

## 2024-01-10 MED ORDER — ONDANSETRON HCL 4 MG/2ML IJ SOLN: 4.0000 mg | Freq: Four times a day (QID) | INTRAMUSCULAR | Status: DC | PRN

## 2024-01-10 MED ORDER — MELATONIN 5 MG PO TABS
5.0000 mg | ORAL_TABLET | Freq: Every evening | ORAL | Status: DC | PRN
  Administered 2024-01-10: 5 mg via ORAL
  Filled 2024-01-10 (×2): qty 1

## 2024-01-10 MED ORDER — ACETAMINOPHEN 500 MG PO TABS
1000.0000 mg | ORAL_TABLET | Freq: Once | ORAL | Status: AC
  Administered 2024-01-10: 1000 mg via ORAL
  Filled 2024-01-10: qty 2

## 2024-01-10 MED ORDER — METOPROLOL TARTRATE 50 MG PO TABS
50.0000 mg | ORAL_TABLET | Freq: Two times a day (BID) | ORAL | Status: DC
  Administered 2024-01-10: 50 mg via ORAL
  Filled 2024-01-10 (×2): qty 1

## 2024-01-10 MED ORDER — ACETAMINOPHEN 325 MG PO TABS
650.0000 mg | ORAL_TABLET | Freq: Four times a day (QID) | ORAL | Status: DC | PRN
  Administered 2024-01-10: 650 mg via ORAL
  Filled 2024-01-10 (×2): qty 2

## 2024-01-10 MED ORDER — INSULIN ASPART 100 UNIT/ML IJ SOLN
0.0000 [IU] | Freq: Three times a day (TID) | INTRAMUSCULAR | Status: DC
  Administered 2024-01-10: 2 [IU] via SUBCUTANEOUS
  Administered 2024-01-12: 1 [IU] via SUBCUTANEOUS
  Administered 2024-01-12: 2 [IU] via SUBCUTANEOUS
  Administered 2024-01-12: 3 [IU] via SUBCUTANEOUS
  Administered 2024-01-13 – 2024-01-14 (×4): 1 [IU] via SUBCUTANEOUS
  Filled 2024-01-10 (×10): qty 1

## 2024-01-10 MED ORDER — SODIUM CHLORIDE 0.9 % IV BOLUS: 1000.0000 mL | Freq: Once | INTRAVENOUS | Status: DC

## 2024-01-10 MED ORDER — METOPROLOL TARTRATE 5 MG/5ML IV SOLN
5.0000 mg | Freq: Once | INTRAVENOUS | Status: AC
  Administered 2024-01-10: 5 mg via INTRAVENOUS

## 2024-01-10 MED ORDER — MAGNESIUM SULFATE 2 GM/50ML IV SOLN
2.0000 g | Freq: Once | INTRAVENOUS | Status: AC
  Administered 2024-01-10: 2 g via INTRAVENOUS
  Filled 2024-01-10: qty 50

## 2024-01-10 MED ORDER — MAGNESIUM SULFATE IN D5W 1-5 GM/100ML-% IV SOLN
1.0000 g | Freq: Once | INTRAVENOUS | Status: DC
  Filled 2024-01-10: qty 100

## 2024-01-10 MED ORDER — METOPROLOL TARTRATE 5 MG/5ML IV SOLN
5.0000 mg | INTRAVENOUS | Status: DC | PRN
  Filled 2024-01-10: qty 5

## 2024-01-10 MED ORDER — LOPERAMIDE HCL 2 MG PO CAPS
2.0000 mg | ORAL_CAPSULE | ORAL | Status: DC | PRN
  Administered 2024-01-10 – 2024-01-13 (×4): 2 mg via ORAL
  Filled 2024-01-10 (×5): qty 1

## 2024-01-10 MED ORDER — PANTOPRAZOLE SODIUM 40 MG IV SOLR
40.0000 mg | Freq: Once | INTRAVENOUS | Status: AC
  Administered 2024-01-10: 40 mg via INTRAVENOUS
  Filled 2024-01-10: qty 10

## 2024-01-10 MED ORDER — RISAQUAD PO CAPS
2.0000 | ORAL_CAPSULE | Freq: Three times a day (TID) | ORAL | Status: DC
  Administered 2024-01-10 – 2024-01-13 (×9): 2 via ORAL
  Filled 2024-01-10 (×13): qty 2

## 2024-01-10 MED ORDER — SODIUM CHLORIDE 0.9 % IV SOLN: INTRAVENOUS | Status: AC

## 2024-01-10 MED ORDER — HEPARIN SODIUM (PORCINE) 5000 UNIT/ML IJ SOLN
5000.0000 [IU] | Freq: Three times a day (TID) | INTRAMUSCULAR | Status: DC
  Administered 2024-01-10: 5000 [IU] via SUBCUTANEOUS
  Filled 2024-01-10 (×3): qty 1

## 2024-01-10 MED ORDER — DIGOXIN 0.25 MG/ML IJ SOLN
0.1250 mg | Freq: Once | INTRAMUSCULAR | Status: AC
  Administered 2024-01-10: 0.125 mg via INTRAVENOUS
  Filled 2024-01-10: qty 2

## 2024-01-10 MED ORDER — HYDROMORPHONE HCL 1 MG/ML IJ SOLN
0.5000 mg | INTRAMUSCULAR | Status: DC | PRN
  Administered 2024-01-12 (×3): 1 mg via INTRAVENOUS
  Administered 2024-01-12: 0.5 mg via INTRAVENOUS
  Administered 2024-01-13 (×2): 1 mg via INTRAVENOUS
  Filled 2024-01-10 (×7): qty 1

## 2024-01-10 MED ORDER — ACETAMINOPHEN 650 MG RE SUPP: 650.0000 mg | Freq: Four times a day (QID) | RECTAL | Status: DC | PRN

## 2024-01-10 MED ORDER — SODIUM CHLORIDE 0.9 % IV BOLUS
1000.0000 mL | INTRAVENOUS | Status: AC
  Administered 2024-01-10: 1000 mL via INTRAVENOUS

## 2024-01-10 MED ORDER — ONDANSETRON HCL 4 MG PO TABS
4.0000 mg | ORAL_TABLET | Freq: Four times a day (QID) | ORAL | Status: DC | PRN
  Administered 2024-01-10: 4 mg via ORAL
  Filled 2024-01-10: qty 1

## 2024-01-10 MED ORDER — SODIUM CHLORIDE 0.9 % IV BOLUS
1000.0000 mL | Freq: Once | INTRAVENOUS | Status: AC
  Administered 2024-01-10: 1000 mL via INTRAVENOUS

## 2024-01-10 MED ORDER — AZITHROMYCIN 250 MG PO TABS
500.0000 mg | ORAL_TABLET | Freq: Every day | ORAL | Status: DC
  Administered 2024-01-10 – 2024-01-12 (×2): 500 mg via ORAL
  Filled 2024-01-10: qty 1
  Filled 2024-01-10 (×2): qty 2

## 2024-01-10 NOTE — ED Notes (Addendum)
 Pt given blanket, this RN called lab to come draw Pt blood as Pt is a hard stick and pulled out his first IV. Attending notified of abnormal GI panel and lactic.

## 2024-01-10 NOTE — H&P (Addendum)
 History and Physical    BRONX BROGDEN ZOX:[id number] DOB: [birth year] DOA: 01/10/2024  PCP: Shari Daughters, MD (Confirm with patient/family/NH records and if not entered, this has to be entered at [facility] point of entry) Patient coming from: home  I have personally briefly reviewed patient's old medical records in [facility]  Chief Complaint: Nauseous vomiting whole body ache, diarrhea  HPI: Steven Mcintosh is a 53 y.o. male with medical history significant of IIDM, HTN, HLD, fatty liver, hepatitis C s/p treatment, PAF, morbid obesity, presented with worsening symptoms of persistent diarrhea, nauseous vomiting whole body ache.  Patient traveled to [place] last week.  After eating some street food on Monday, he started to have whole body ache on Tuesday and feeling malaise and poor appetite.  Wednesday, he started to have watery diarrhea, denied any abdominal pain, no full smell, initially was mild then has become constant, and lost appetite completely and last time he ate was Wednesday.  Since then he has been able to drink water but he said that every time after he drinks something he will have diarrhea almost immediately and he also has episode of bowel incontinent at night.  Meantime he noticed his urine output has significantly decreased and the color was very dark.  He went to a local pharmacy at [place] on Wednesday and he took amoxicillin 2 times on Wednesday and has been taking OTC pain medication 1-2 times a day for the last 5 days.  In addition, he also reported intermittent episodes of chills and sweaty.  Eventually he came back on the flight last night and came to the hospital.  He also said that he has been taking all his BP and diabetes medication despite the sickness.  His family's including daughter and grandchildren accompanied with him have no complaints or any symptoms.  Denied any cough, no chest pain no dysuria.  ED Course: Afebrile, blood and  tachycardia borderline hypotension blood pressure 103/69 O2 saturation 96% on room air.  Blood work showed BUN 27 creatinine 2.0 compared to baseline 0.5 about 6 months ago, AST 179 ALT 245 bilirubin 4.0 WBC 7.9 hemoglobin 18.8.  COVID-negative.  Patient was given IV bolus in the ED. Review of Systems: As per HPI otherwise 14 point review of systems negative.    Past Medical History:  Diagnosis Date   Diabetes mellitus without complication (HCC)    Hypercholesteremia unk   Hypertension     Past Surgical History:  Procedure Laterality Date   DIAPHRAGM SURGERY     ENUCLEATION Left      reports that he has been smoking. He does not have any smokeless tobacco history on file. He reports current alcohol use. He reports that he does not currently use drugs.  No Known Allergies  History reviewed. No pertinent family history.   Prior to Admission medications   Medication Sig Start Date End Date Taking? Authorizing Provider  amLODipine  (NORVASC ) 5 MG tablet Take 1 tablet (5 mg total) by mouth every morning. 10/19/23 04/16/24  Shari Daughters, MD  atorvastatin  (LIPITOR) 40 MG tablet TAKE 1 TABLET BY MOUTH ONCE DAILY IN THE EVENING 11/27/23   Shari Daughters, MD  carvedilol  (COREG ) 25 MG tablet Take 2 tablets by mouth twice daily 11/18/23   Tejan-Sie, S Ahmed, MD  cyclobenzaprine  (FLEXERIL ) 10 MG tablet TAKE 1 TABLET BY MOUTH THREE TIMES DAILY AS NEEDED FOR  NECK  PAIN 09/04/23   Shari Daughters, MD  ibuprofen  (ADVIL )  800 MG tablet Take 1 tablet (800 mg total) by mouth 3 (three) times daily as needed. 11/30/23   Tejan-Sie, S Ahmed, MD  Intimacy Products (EROXON) GEL Apply 1 Package topically as directed for 4 doses. Apply one tube to glans penis prior to intercourse 11/30/23   Shari Daughters, MD  INVOKANA  300 MG TABS tablet Take 1 tablet (300 mg total) by mouth every morning. 11/30/23   Shari Daughters, MD  metFORMIN  (GLUCOPHAGE -XR) 500 MG 24 hr tablet Take 1 tablet (500 mg total) by  mouth daily with breakfast. 03/20/23 03/14/24  Shari Daughters, MD  OZEMPIC , 2 MG/DOSE, 8 MG/3ML SOPN INJECT 2MG   SUBCUTANEOUSLY ONCE A WEEK 11/30/23   Shari Daughters, MD  pioglitazone  (ACTOS ) 15 MG tablet Take 1 tablet (15 mg total) by mouth daily. 11/30/23   Tejan-Sie, S Ahmed, MD  sildenafil  (VIAGRA ) 100 MG tablet Take 1 tablet (100 mg total) by mouth as needed for erectile dysfunction. 11/30/23 02/28/24  Shari Daughters, MD  traZODone  (DESYREL ) 50 MG tablet Take 1 tablet (50 mg total) by mouth at bedtime. 11/30/23 01/29/24  Shari Daughters, MD  valsartan  (DIOVAN ) 320 MG tablet Take 1 tablet (320 mg total) by mouth every morning. 10/19/23 04/16/24  Shari Daughters, MD    Physical Exam: Vitals:   01/10/24 0705 01/10/24 0714 01/10/24 0715 01/10/24 0845  BP:   103/69   Pulse:    (!) 103  Resp:   (!) 32 15  Temp:  98.2 F (36.8 C)    TempSrc:  Oral    SpO2: 99%   96%    Constitutional: NAD, calm, comfortable Vitals:   01/10/24 0705 01/10/24 0714 01/10/24 0715 01/10/24 0845  BP:   103/69   Pulse:    (!) 103  Resp:   (!) 32 15  Temp:  98.2 F (36.8 C)    TempSrc:  Oral    SpO2: 99%   96%   Eyes: PERRL, lids and conjunctivae normal ENMT: Mucous membranes are dry. Posterior pharynx clear of any exudate or lesions.Normal dentition.  Neck: normal, supple, no masses, no thyromegaly Respiratory: clear to auscultation bilaterally, no wheezing, no crackles. Normal respiratory effort. No accessory muscle use.  Cardiovascular: Regular rate and rhythm, no murmurs / rubs / gallops. No extremity edema. 2+ pedal pulses. No carotid bruits.  Abdomen: no tenderness, no masses palpated. No hepatosplenomegaly. Bowel sounds positive.  Musculoskeletal: no clubbing / cyanosis. No joint deformity upper and lower extremities. Good ROM, no contractures. Normal muscle tone.  Skin: no rashes, lesions, ulcers. No induration Neurologic: CN 2-12 grossly intact. Sensation intact, DTR normal. Strength 5/5 in all  4.  Psychiatric: Normal judgment and insight. Alert and oriented x 3. Normal mood.     Labs on Admission: I have personally reviewed following labs and imaging studies  CBC: Recent Labs  Lab 01/10/24 0716  WBC 7.2  NEUTROABS 5.0  HGB 18.8*  HCT 55.5*  MCV 76.7*  PLT 159   Basic Metabolic Panel: Recent Labs  Lab 01/10/24 0716  NA 131*  K 4.0  CL 95*  CO2 21*  GLUCOSE 187*  BUN 27*  CREATININE 2.67*  CALCIUM  8.5*   GFR: CrCl cannot be calculated (Unknown ideal weight.). Liver Function Tests: Recent Labs  Lab 01/10/24 0716  AST 179*  ALT 245*  ALKPHOS 40  BILITOT 4.0*  PROT 7.5  ALBUMIN 3.8   Recent Labs  Lab 01/10/24 0716  LIPASE 26   No results for  input(s): AMMONIA in the last 168 hours. Coagulation Profile: Recent Labs  Lab 01/10/24 0716  INR 1.1   Cardiac Enzymes: Recent Labs  Lab 01/10/24 0716  CKTOTAL 102   BNP (last 3 results) No results for input(s): PROBNP in the last 8760 hours. HbA1C: No results for input(s): HGBA1C in the last 72 hours. CBG: No results for input(s): GLUCAP in the last 168 hours. Lipid Profile: No results for input(s): CHOL, HDL, LDLCALC, TRIG, CHOLHDL, LDLDIRECT in the last 72 hours. Thyroid Function Tests: No results for input(s): TSH, T4TOTAL, FREET4, T3FREE, THYROIDAB in the last 72 hours. Anemia Panel: No results for input(s): VITAMINB12, FOLATE, FERRITIN, TIBC, IRON, RETICCTPCT in the last 72 hours. Urine analysis: No results found for: COLORURINE, APPEARANCEUR, LABSPEC, PHURINE, GLUCOSEU, HGBUR, BILIRUBINUR, KETONESUR, PROTEINUR, UROBILINOGEN, NITRITE, LEUKOCYTESUR  Radiological Exams on Admission: No results found.  EKG: Independently reviewed.  Sinus tachycardia, no acute ST changes.  Assessment/Plan Principal Problem:   AKI (acute kidney injury) (HCC) Active Problems:   Diarrhea   LFT elevation  (please populate well all problems  here in Problem List. (For example, if patient is on BP meds at home and you resume or decide to hold them, it is a problem that needs to be her. Same for CAD, COPD, HLD and so on)  AKI with oliguria - Probably multifactorial, from prerenal, likely secondary to ongoing GI loss for last 4-5 as well as a possible damage of suspected OTC NSAIDs intake for days.  Hopefully not ATN - Check renal ultrasound, check FeNa -Strict I and O - Clinically still appears to be severe volume depleted with signs of hypotension as well as hemoconcentration from blood work.  Will give another 2 L of IV bolus and started on maintenance IV fluid.  Acute transaminitis Acute bilirubinemia History of fatty liver - No RUQ symptoms, synthetic liver function preserved with normal albumin level and normal INR - Check acute hepatitis panel, check acetaminophen  level - Hold off statin  Nauseous vomiting diarrhea - Clinically and history was appears to be a travelers diarrhea - Rule out C. difficile colitis, GI pathogen pending - Monitor off antibiotics  SIRS - Elevated lactate acid, and tachycardia - Might have gastroenteritis, monitor off antibiotics  PAF -In sinus -CHADS2=1  HTN - Hold off on BP meds - Start as needed hydralazine  IIDM -Hold o all p.o. diabetic medications - SSI  DVT prophylaxis: Heparin subcu Code Status: Full code Family Communication: Daughter at bedside Disposition Plan: Expect more than 2 midnight hospital stay, patient coming with complicated medical problems of AKI and acute liver damage, requiring extensive workup and close monitoring of recovery of kidney function liver function, expect more than 2 midnight hospital stay Consults called: None Admission status: PCU admit   [place] MD [facility] Pager [phone]  01/10/2024, 9:44 AM

## 2024-01-10 NOTE — Progress Notes (Signed)
 Patient started have palpitations and EKG showed rapid A-fib.  Responded to IV Lopressor push.  Blood pressure 123/90 will start patient on metoprolol 50 mg twice daily with as needed Lopressor IV push for breakthrough heart rate.  Check magnesium and phosphorus level.

## 2024-01-10 NOTE — ED Provider Notes (Signed)
 [facility] Provider Note    Event Date/Time   First MD Initiated Contact with Patient 01/10/24 [phone]     (approximate)   History   Nausea (N/D)   HPI  Steven Mcintosh is a 53 y.o. male with history of of diabetes, hypertension who comes in with concerns for nausea vomiting diarrhea.  Patient reports starting to feel sick on Tuesday.  He reports multiple episodes of nausea vomiting and diarrhea.  He states that he feels dehydrated.  He denies any Tylenol  or ibuprofen  this morning and with EMS his temperature was 99.  He denies any chest pain, shortness of breath, had some abdominal cramping yesterday but denies any abdominal pain now.  He denies anyone else with his family being sick.  He did recently just get back from [place].    Physical Exam   Triage Vital Signs: ED Triage Vitals  Encounter Vitals Group     BP --      Girls Systolic BP Percentile --      Girls Diastolic BP Percentile --      Boys Systolic BP Percentile --      Boys Diastolic BP Percentile --      Pulse --      Resp --      Temp 01/10/24 0714 98.2 F (36.8 C)     Temp Source 01/10/24 0714 Oral     SpO2 01/10/24 0705 99 %     Weight --      Height --      Head Circumference --      Peak Flow --      Pain Score --      Pain Loc --      Pain Education --      Exclude from Growth Chart --     Most recent vital signs: Vitals:   01/10/24 0705 01/10/24 0714  Temp:  98.2 F (36.8 C)  SpO2: 99%      General: Awake, no distress.  CV:  Good peripheral perfusion.  Resp:  Normal effort.  Abd:  No distention.  Soft and nontender Other:  No swelling in legs   ED Results / Procedures / Treatments   Labs (all labs ordered are listed, but only abnormal results are displayed) Labs Reviewed  CULTURE, BLOOD (ROUTINE X 2)  CULTURE, BLOOD (ROUTINE X 2)  RESP PANEL BY RT-PCR (RSV, FLU A&B, COVID)  [id number]  GASTROINTESTINAL PANEL BY PCR, STOOL (REPLACES STOOL CULTURE)   C DIFFICILE QUICK SCREEN W PCR REFLEX    LACTIC ACID, PLASMA  LACTIC ACID, PLASMA  COMPREHENSIVE METABOLIC PANEL WITH GFR  CBC WITH DIFFERENTIAL/PLATELET  PROTIME-INR  URINALYSIS, W/ REFLEX TO CULTURE (INFECTION SUSPECTED)  PROCALCITONIN  PARASITE EXAM, BLOOD  CBC WITH DIFFERENTIAL/PLATELET     EKG  My interpretation of EKG:  Sinus tachycardia rate of 104 without any ST elevation or T wave inversions, normal intervals    PROCEDURES:  Critical Care performed: No  Procedures   MEDICATIONS ORDERED IN ED: Medications  sodium chloride 0.9 % bolus 1,000 mL (has no administration in time range)  acetaminophen  (TYLENOL ) tablet 1,000 mg (has no administration in time range)  ondansetron (ZOFRAN) injection 4 mg (has no administration in time range)  pantoprazole (PROTONIX) injection 40 mg (has no administration in time range)     IMPRESSION / MDM / ASSESSMENT AND PLAN / ED COURSE  I reviewed the triage vital signs and the nursing notes.   Patient's  presentation is most consistent with acute presentation with potential threat to life or bodily function.   Patient comes in with concerns for nausea and vomiting after having been in the [place].  No report of any known fevers.  Suspect GI illness and given recent travel I do feel a patient will need stool studies at home but will get C. difficile as well, COVID, flu.  Will also test for malaria although he has had no reported fever so this seems less likely.  Will check labs to evaluate for electrolyte abnormalities, DKA.  Will treat patient symptomatically with medications.  His abdomen is soft and nontender low suspicion for acute abdominal process at this time.  CK is normal.  Lactate is elevated.  CMP shows AKI with LFT elevation.  INR normal COVID, flu are negative lipase is normal  Given LFT abnormality with AKI did recommend admission to the hospital for further workup stool studies, smear are still pending.  May  need discussion with infectious disease.  Even with his elevated liver test his INR is normal.  He is got no upper abdominal pain to suggest this being a gallstone pathology.  8:53 AM repeat abdominal exam is soft and nontender he continues to deny any shortness of breath.  Or coughing.  With the elevated liver test he denies any alcohol, excessive Tylenol  use.  Will add on hepatitis studies  Discussed with patient given these abnormalities I recommend admission to the hospital he expressed understanding felt comfortable with this plan.  The patient is on the cardiac monitor to evaluate for evidence of arrhythmia and/or significant heart rate changes.      FINAL CLINICAL IMPRESSION(S) / ED DIAGNOSES   Final diagnoses:  AKI (acute kidney injury) (HCC)  Elevated liver enzymes  Gastroenteritis     Rx / DC Orders   ED Discharge Orders     None        Note:  This document was prepared using Dragon voice recognition software and may include unintentional dictation errors.   Lubertha Rush, MD 01/10/24 [phone]

## 2024-01-10 NOTE — ED Triage Notes (Signed)
 Pt in via [facility] from home for N/V since wed. In the DR when his symptoms started.  No EMS interventions  104/76 BS- 188 99.9 temp Recently traveled to the [place]

## 2024-01-10 NOTE — Progress Notes (Signed)
 GI pathogen positive for enterotoxigenic E. coli.  Discussed with on-call infectious disease attending Dr. Levern Reader, who recommend azithromycin 500 mg daily and enteric isolation.  Acute hepatitis panel sent.

## 2024-01-11 DIAGNOSIS — N179 Acute kidney failure, unspecified: Secondary | ICD-10-CM | POA: Diagnosis not present

## 2024-01-11 LAB — CBC
HCT: 49.3 % (ref 39.0–52.0)
Hemoglobin: 16.3 g/dL (ref 13.0–17.0)
MCH: 25.6 pg — ABNORMAL LOW (ref 26.0–34.0)
MCHC: 33.1 g/dL (ref 30.0–36.0)
MCV: 77.4 fL — ABNORMAL LOW (ref 80.0–100.0)
Platelets: 157 10*3/uL (ref 150–400)
RBC: 6.37 MIL/uL — ABNORMAL HIGH (ref 4.22–5.81)
RDW: 14.8 % (ref 11.5–15.5)
WBC: 4.8 10*3/uL (ref 4.0–10.5)
nRBC: 0 % (ref 0.0–0.2)

## 2024-01-11 LAB — COMPREHENSIVE METABOLIC PANEL WITH GFR
ALT: 432 U/L — ABNORMAL HIGH (ref 0–44)
AST: 156 U/L — ABNORMAL HIGH (ref 15–41)
Albumin: 3.2 g/dL — ABNORMAL LOW (ref 3.5–5.0)
Alkaline Phosphatase: 34 U/L — ABNORMAL LOW (ref 38–126)
Anion gap: 11 (ref 5–15)
BUN: 27 mg/dL — ABNORMAL HIGH (ref 6–20)
CO2: 20 mmol/L — ABNORMAL LOW (ref 22–32)
Calcium: 7.8 mg/dL — ABNORMAL LOW (ref 8.9–10.3)
Chloride: 104 mmol/L (ref 98–111)
Creatinine, Ser: 1.11 mg/dL (ref 0.61–1.24)
GFR, Estimated: >60 mL/min (ref >60)
Glucose, Bld: 115 mg/dL — ABNORMAL HIGH (ref 70–99)
Potassium: 3.3 mmol/L — ABNORMAL LOW (ref 3.5–5.1)
Sodium: 135 mmol/L (ref 135–145)
Total Bilirubin: 2.6 mg/dL — ABNORMAL HIGH (ref 0.0–1.2)
Total Protein: 6.7 g/dL (ref 6.5–8.1)

## 2024-01-11 LAB — IRON AND TIBC
Iron: 56 ug/dL (ref 45–182)
Saturation Ratios: 20 % (ref 17.9–39.5)
TIBC: 283 ug/dL (ref 250–450)
UIBC: 227 ug/dL

## 2024-01-11 LAB — GLUCOSE, CAPILLARY
Glucose-Capillary: 138 mg/dL — ABNORMAL HIGH (ref 70–99)
Glucose-Capillary: 130 mg/dL — ABNORMAL HIGH (ref 70–99)
Glucose-Capillary: 111 mg/dL — ABNORMAL HIGH (ref 70–99)
Glucose-Capillary: 133 mg/dL — ABNORMAL HIGH (ref 70–99)

## 2024-01-11 LAB — ETHANOL: Alcohol, Ethyl (B): <15 mg/dL (ref <15)

## 2024-01-11 LAB — VITAMIN D 25 HYDROXY (VIT D DEFICIENCY, FRACTURES): Vit D, 25-Hydroxy: 8.31 ng/mL — ABNORMAL LOW (ref 30–100)

## 2024-01-11 LAB — MAGNESIUM: Magnesium: 2.2 mg/dL (ref 1.7–2.4)

## 2024-01-11 MED ORDER — HYDRALAZINE HCL 50 MG PO TABS
50.0000 mg | ORAL_TABLET | Freq: Four times a day (QID) | ORAL | Status: DC | PRN
  Administered 2024-01-13: 50 mg via ORAL
  Filled 2024-01-11: qty 1

## 2024-01-11 MED ORDER — SACCHAROMYCES BOULARDII 250 MG PO CAPS: 250.0000 mg | ORAL_CAPSULE | Freq: Two times a day (BID) | ORAL | Status: DC

## 2024-01-11 MED ORDER — METOPROLOL TARTRATE 25 MG PO TABS
25.0000 mg | ORAL_TABLET | Freq: Once | ORAL | Status: AC
  Filled 2024-01-11: qty 1

## 2024-01-11 MED ORDER — POTASSIUM CHLORIDE CRYS ER 20 MEQ PO TBCR
40.0000 meq | EXTENDED_RELEASE_TABLET | Freq: Once | ORAL | Status: AC
  Filled 2024-01-11: qty 2

## 2024-01-11 MED ORDER — ATORVASTATIN CALCIUM 20 MG PO TABS
40.0000 mg | ORAL_TABLET | Freq: Every evening | ORAL | Status: DC
  Administered 2024-01-12 – 2024-01-13 (×2): 40 mg via ORAL
  Filled 2024-01-11 (×3): qty 2

## 2024-01-11 MED ORDER — APIXABAN 5 MG PO TABS
5.0000 mg | ORAL_TABLET | Freq: Two times a day (BID) | ORAL | Status: DC
  Administered 2024-01-12 – 2024-01-13 (×4): 5 mg via ORAL
  Filled 2024-01-11 (×6): qty 1

## 2024-01-11 MED ORDER — AMLODIPINE BESYLATE 5 MG PO TABS: 5.0000 mg | ORAL_TABLET | Freq: Every morning | ORAL | Status: DC

## 2024-01-11 MED ORDER — PANTOPRAZOLE SODIUM 40 MG PO TBEC
40.0000 mg | DELAYED_RELEASE_TABLET | Freq: Two times a day (BID) | ORAL | Status: AC
  Administered 2024-01-12: 40 mg via ORAL
  Filled 2024-01-11 (×3): qty 1

## 2024-01-11 MED ORDER — DILTIAZEM HCL-DEXTROSE 125-5 MG/125ML-% IV SOLN (PREMIX)
5.0000 mg/h | INTRAVENOUS | Status: AC
  Administered 2024-01-12: 15 mg/h via INTRAVENOUS
  Filled 2024-01-11 (×3): qty 125

## 2024-01-11 MED ORDER — MAGNESIUM SULFATE 4 GM/100ML IV SOLN
4.0000 g | Freq: Once | INTRAVENOUS | Status: AC
  Filled 2024-01-11: qty 100

## 2024-01-11 MED ORDER — PANTOPRAZOLE SODIUM 40 MG PO TBEC
40.0000 mg | DELAYED_RELEASE_TABLET | Freq: Every day | ORAL | Status: DC
  Administered 2024-01-13: 40 mg via ORAL
  Filled 2024-01-11 (×2): qty 1

## 2024-01-11 MED ORDER — HYDRALAZINE HCL 20 MG/ML IJ SOLN: 5.0000 mg | Freq: Four times a day (QID) | INTRAMUSCULAR | Status: DC | PRN

## 2024-01-11 MED ORDER — METOPROLOL TARTRATE 50 MG PO TABS
75.0000 mg | ORAL_TABLET | Freq: Two times a day (BID) | ORAL | Status: DC
  Administered 2024-01-12 – 2024-01-13 (×3): 75 mg via ORAL
  Filled 2024-01-11 (×5): qty 1

## 2024-01-11 NOTE — Consult Note (Signed)
 [facility] CLINIC CARDIOLOGY CONSULT NOTE       Patient ID: [name] MRN: [id number] DOB/AGE: [birth year] 53 y.o.  Admit date: 01/10/2024 Referring Physician Dr. Hubert Madden Primary Physician Shari Daughters, MD Primary Cardiologist None Reason for Consultation paroxysmal atrial fibrillation  HPI: Steven Mcintosh is a 53 y.o. male  with a past medical history of hypertension, hyperlipidemia, IIDM, fatty liver, hepatitis C, obesity who presented to the ED on 01/10/2024 for persistent nausea, vomiting, diarrhea and generalized bodyaches.  On 06/15 around 1700 telemetry showed patient was in atrial fibrillation with RVR rates in the 140s.  Patient converted into sinus rhythm s/p 1 dose of IV metoprolol tartrate 5 mg and 1 dose of IV digoxin 0.125.  Patient reports no known history of paroxysmal atrial fibrillation.  Cardiology was consulted for further evaluation.   Patient presented to the ED with symptoms consistent with gastroenteritis. Work up in the ED notable for sodium 131, potassium 4.0, creatinine 2.67, hemoglobin 18.8, platelets 159, WBC 7.2.  AST 179, ALT 245.  Elevated total bilirubin of 4.0.  Lactate elevated and trending 2.4 > 2.2.  GI panel positive for EPEC and ETEC. EKG in ED with Sinus tachycardia rate 104 bpm.  Patient received 1 dose of IV metoprolol tartrate 5 mg and 1 dose of IV digoxin 0.125 mg, given IV fluids and replenish electrolytes.  At the time of my evaluation this afternoon, patient was resting comfortably in hospital bed.  We discussed patient's symptoms in further detail.  Patient states he has no known history of atrial fibrillation.  Patient reports about 1 week ago he noticed an episode of palpitations/fluttering sensation.  Patient states yesterday when he was in atrial fibrillation he denies any palpitations, chest pain, lightheadedness or shortness of breath.  Patient states he did not know he was in atrial fibrillation until someone told him.  Currently patient is  in sinus rhythm with rates in the 90s and continues to deny any cardiac symptoms.  Review of systems complete and found to be negative unless listed above    Past Medical History:  Diagnosis Date   Diabetes mellitus without complication (HCC)    Hypercholesteremia unk   Hypertension     Past Surgical History:  Procedure Laterality Date   DIAPHRAGM SURGERY     ENUCLEATION Left     Medications Prior to Admission  Medication Sig Dispense Refill Last Dose/Taking   amLODipine  (NORVASC ) 5 MG tablet Take 1 tablet (5 mg total) by mouth every morning. 30 tablet 0 Past Week   atorvastatin  (LIPITOR) 40 MG tablet TAKE 1 TABLET BY MOUTH ONCE DAILY IN THE EVENING 90 tablet 0 Past Week   carvedilol  (COREG ) 25 MG tablet Take 2 tablets by mouth twice daily 120 tablet 3 01/09/2024 Bedtime   cyclobenzaprine  (FLEXERIL ) 10 MG tablet TAKE 1 TABLET BY MOUTH THREE TIMES DAILY AS NEEDED FOR  NECK  PAIN 90 tablet 0 Taking   ibuprofen  (ADVIL ) 800 MG tablet Take 1 tablet (800 mg total) by mouth 3 (three) times daily as needed. 90 tablet 0 Taking As Needed   INVOKANA  300 MG TABS tablet Take 1 tablet (300 mg total) by mouth every morning. 90 tablet 0 Past Week   metFORMIN  (GLUCOPHAGE -XR) 500 MG 24 hr tablet Take 1 tablet (500 mg total) by mouth daily with breakfast. 360 tablet 0 Past Week   OZEMPIC , 2 MG/DOSE, 8 MG/3ML SOPN INJECT 2MG   SUBCUTANEOUSLY ONCE A WEEK 3 mL 2 Past Month   sildenafil  (VIAGRA )  100 MG tablet Take 1 tablet (100 mg total) by mouth as needed for erectile dysfunction. 15 tablet 2 Taking As Needed   traZODone  (DESYREL ) 50 MG tablet Take 1 tablet (50 mg total) by mouth at bedtime. 30 tablet 1 01/09/2024   valsartan  (DIOVAN ) 320 MG tablet Take 1 tablet (320 mg total) by mouth every morning. 30 tablet 0 Past Week   Intimacy Products (EROXON) GEL Apply 1 Package topically as directed for 4 doses. Apply one tube to glans penis prior to intercourse 1 each 1    pioglitazone  (ACTOS ) 15 MG tablet Take 1  tablet (15 mg total) by mouth daily. (Patient not taking: Reported on 01/10/2024) 90 tablet 0 Not Taking   Social History   Socioeconomic History   Marital status: Single    Spouse name: Not on file   Number of children: Not on file   Years of education: Not on file   Highest education level: Not on file  Occupational History   Not on file  Tobacco Use   Smoking status: Every Day   Smokeless tobacco: Not on file  Substance and Sexual Activity   Alcohol use: Yes   Drug use: Not Currently   Sexual activity: Yes  Other Topics Concern   Not on file  Social History Narrative   Not on file   Social Drivers of Health   Financial Resource Strain: Not on file  Food Insecurity: Not on file  Transportation Needs: Not on file  Physical Activity: Not on file  Stress: Not on file  Social Connections: Not on file  Intimate Partner Violence: Not on file    History reviewed. No pertinent family history.   Vitals:   01/11/24 0341 01/11/24 0750 01/11/24 1231 01/11/24 1526  BP: 129/73 (!) 155/79 (!) 171/86 (!) 156/86  Pulse: 94 93 88 92  Resp: 18 20 20 20   Temp: 98.2 F (36.8 C) 97.9 F (36.6 C) 98.3 F (36.8 C) 98 F (36.7 C)  TempSrc:      SpO2: 95% 97% 95% 94%    PHYSICAL EXAM General: Well-appearing male, well nourished, in no acute distress. HEENT: Normocephalic and atraumatic. Neck: No JVD.   Lungs: Normal respiratory effort on room air.  Diminished breath sounds bilaterally Heart: HRRR. Normal S1 and S2 without gallops or murmurs.  Abdomen: Non-distended appearing.  Msk: Normal strength and tone for age. Extremities: Warm and well perfused. No clubbing, cyanosis, edema.  Neuro: Alert and oriented X 3. Psych: Answers questions appropriately.   Labs: Basic Metabolic Panel: Recent Labs    01/10/24 0716 01/11/24 0259  NA 131* 135  K 4.0 3.3*  CL 95* 104  CO2 21* 20*  GLUCOSE 187* 115*  BUN 27* 27*  CREATININE 2.67* 1.11  CALCIUM  8.5* 7.8*  MG 1.4* 2.2  PHOS  4.4  --    Liver Function Tests: Recent Labs    01/10/24 0716 01/11/24 0259  AST 179* 156*  ALT 245* 432*  ALKPHOS 40 34*  BILITOT 4.0* 2.6*  PROT 7.5 6.7  ALBUMIN 3.8 3.2*   Recent Labs    01/10/24 0716  LIPASE 26   CBC: Recent Labs    01/10/24 0716 01/11/24 0259  WBC 7.2 4.8  NEUTROABS 5.0  --   HGB 18.8* 16.3  HCT 55.5* 49.3  MCV 76.7* 77.4*  PLT 159 157   Cardiac Enzymes: Recent Labs    01/10/24 0716  CKTOTAL 102   BNP: No results for input(s): BNP in the last  72 hours. D-Dimer: No results for input(s): DDIMER in the last 72 hours. Hemoglobin A1C: No results for input(s): HGBA1C in the last 72 hours. Fasting Lipid Panel: No results for input(s): CHOL, HDL, LDLCALC, TRIG, CHOLHDL, LDLDIRECT in the last 72 hours. Thyroid Function Tests: No results for input(s): TSH, T4TOTAL, T3FREE, THYROIDAB in the last 72 hours.  Invalid input(s): FREET3 Anemia Panel: Recent Labs    01/11/24 1039  TIBC 283  IRON 56     Radiology: US  Abdomen Limited RUQ (LIVER/GB) Result Date: 01/10/2024 CLINICAL DATA:  [id number] AKI (acute kidney injury) (HCC) [id number]; [id number] LFTs abnormal [id number] EXAM: ULTRASOUND ABDOMEN LIMITED RIGHT UPPER QUADRANT COMPARISON:  None Available. FINDINGS: Gallbladder: No gallstones, pericholecystic fluid or wall thickening visualized. Common bile duct: Diameter: 0.3 cm. Liver: Liver is hyperechoic consistent with fatty infiltration. No focal hepatic lesions identified. No intrahepatic ductal dilatation. Hepatopetal portal vein flow. IMPRESSION: Hepatic fatty infiltration. Otherwise unremarkable examination of the right upper quadrant. EXAM: RENAL / URINARY TRACT ULTRASOUND COMPLETE COMPARISON:  None Available. FINDINGS: The right kidney measured 12.5 cm and the left kidney measured 13.6 cm. The kidneys demonstrate normal echogenicity. No renal parenchymal lesions are identified. No shadowing stones are seen. No hydronephrosis.  Bladder: Empty and not evaluable. IMPRESSION: Unremarkable renal ultrasound. Electronically Signed   By: Sydell Eva M.D.   On: 01/10/2024 10:29   US  RENAL Result Date: 01/10/2024 CLINICAL DATA:  [id number] AKI (acute kidney injury) (HCC) [id number]; [id number] LFTs abnormal [id number] EXAM: ULTRASOUND ABDOMEN LIMITED RIGHT UPPER QUADRANT COMPARISON:  None Available. FINDINGS: Gallbladder: No gallstones, pericholecystic fluid or wall thickening visualized. Common bile duct: Diameter: 0.3 cm. Liver: Liver is hyperechoic consistent with fatty infiltration. No focal hepatic lesions identified. No intrahepatic ductal dilatation. Hepatopetal portal vein flow. IMPRESSION: Hepatic fatty infiltration. Otherwise unremarkable examination of the right upper quadrant. EXAM: RENAL / URINARY TRACT ULTRASOUND COMPLETE COMPARISON:  None Available. FINDINGS: The right kidney measured 12.5 cm and the left kidney measured 13.6 cm. The kidneys demonstrate normal echogenicity. No renal parenchymal lesions are identified. No shadowing stones are seen. No hydronephrosis. Bladder: Empty and not evaluable. IMPRESSION: Unremarkable renal ultrasound. Electronically Signed   By: Sydell Eva M.D.   On: 01/10/2024 10:29    ECHO ordered.  TELEMETRY reviewed by me 01/11/2024: Sinus rhythm, rate 90s (Episode of atrial fibrillation on 06/15 at around 1700)   EKG reviewed by me: Sinus tachycardia rate 104 bpm  Data reviewed by me 01/11/2024: last 24h vitals tele labs imaging I/O ED provider note, admission H&P.  Principal Problem:   AKI (acute kidney injury) (HCC) Active Problems:   Diarrhea   LFT elevation    ASSESSMENT AND PLAN:  Steven Mcintosh is a 53 y.o. male  with a past medical history of hypertension, hyperlipidemia, IIDM, fatty liver, hepatitis C, obesity who presented to the ED on 01/10/2024 for persistent nausea, vomiting, diarrhea and generalized bodyaches.  On 06/15 around 1700 telemetry showed patient was in atrial  fibrillation with RVR rates in the 140s.  Patient converted into sinus rhythm s/p 1 dose of IV metoprolol tartrate 5 mg and 1 dose of IV digoxin 0.125.  Patient reports no known history of paroxysmal atrial fibrillation.  Cardiology was consulted for further evaluation.   # Sepsis secondary to acute gastroenteritis by E Coli. # History of hepatitis C, fatty liver # Paroxsymal atrial fibrillation # Hypertension # Hyperlipidemia Patient with no known history of atrial fibrillation presents with sxs of gastroenteritis. Tele on 06/15 at around 1700 revealed  atrial fibrillation RVR rate in 140s. Patient converted to SR s/p 1x IV metoprolol tartrate 5 mg and 1x IV digoxin 0.125 mg. Per tele patient remains SR with rate 90s. -Echo ordered. -Monitor and replenish electrolytes for a goal K >4, Mag >2  -Increase metoprolol tartrate to 75 mg twice daily. -Ordered IV Cardizem infusion for rate control.  -CHA2DS2VASc score of 2 (in the setting of HTN, diabetes history). Would recommend starting anticoagulation for stroke risk reduction. Patient denies any recent falls, or prior major bleeding.  -Ordered Eliquis 5 mg twice daily for stroke risk reduction.  -Continue aspirin 81 mg, atorvastatin  40 mg daily.   This patient's plan of care was discussed and created with Dr. Beau Bound and he is in agreement.  Signed: Creighton Doffing, PA-C  01/11/2024, 3:46 PM [facility] Cardiology

## 2024-01-11 NOTE — Progress Notes (Signed)
 [facility] Progress Note  Patient: Steven Mcintosh    ZOX:[id number]  DOA: 01/10/2024     Date of Service: the patient was seen and examined on 01/11/2024  Chief Complaint  Patient presents with   Nausea    N/D   Brief hospital course: Steven Mcintosh is a 53 y.o. male with medical history significant of IIDM, HTN, HLD, fatty liver, hepatitis C s/p treatment, PAF, morbid obesity, presented with worsening symptoms of persistent diarrhea, nauseous vomiting whole body ache.  Patient was found to have traveler's diarrhea, GI pathogen positive for enterotoxigenic E. Coli Patient was started on azithromycin Further management as below.   Assessment and Plan:  # Sepsis secondary to acute gastroenteritis caused by E. Coli   GI pathogen positive for ETEC Continue azithromycin 500 mg p.o. daily Blood culture NGTD   # Transaminitis, repeat due to sepsis US  RUQ fatty liver, no any other findings Trend LFTs  # Paroxysmal A-fib with RVR, acute onset, resolved, converted back to sinus rhythm, S/p IV Lopressor and digoxin given on 6/15 Continue to monitor on telemetry Started Lopressor 50 mg p.o. twice daily Follow 2D echocardiogram Follow cardiology for further recommendation  # AKI with oliguria most likely due to dehydration secondary to acute gastroenteritis Renal sonogram ruled out any significant findings S/p IV fluid, AKI resolved Creatinine 1.11 Mild metabolic acidosis, monitor chemistry  # Hypokalemia, potassium repleted. # Hypomagnesemia, resolved  # HTN Blood pressure was low due to sepsis so held home medications Started Lopressor due to paroxysmal A-fib with RVR BP now elevated Continue monitoring use hydralazine    # IIDM -Hold o all p.o. diabetic medications - SSI   There is no height or weight on file to calculate BMI.  Interventions:  Diet: Carb modified diet DVT Prophylaxis: Subcutaneous Heparin    Advance goals of care discussion: Full  code  Family Communication: family was present at bedside, at the time of interview.  The pt provided permission to discuss medical plan with the family. Opportunity was given to ask question and all questions were answered satisfactorily.   Disposition:  Pt is from home, admitted with traveler's diarrhea, still has slight imbalance, which precludes a safe discharge. Discharge to home, when stable, most likely tomorrow a.m.  Subjective: No significant events overnight, patient had transient A-fib with RVR but converted back to normal sinus rhythm.  Complaining of some off-and-on cramping abdominal pain, had to episode of loose stools today, small quantity.  Denied any other complaints.  Physical Exam: General: NAD, lying comfortably Appear in no distress, affect appropriate Eyes: PERRLA ENT: Oral Mucosa Clear, moist  Neck: no JVD,  Cardiovascular: S1 and S2 Present, no Murmur,  Respiratory: good respiratory effort, Bilateral Air entry equal and Decreased, no Crackles, no wheezes Abdomen: BS present, Soft, obese, mild lower abdominal tenderness Skin: no rashes Extremities: no Pedal edema, no calf tenderness Neurologic: without any new focal findings Gait not checked due to patient safety concerns  Vitals:   01/10/24 2323 01/11/24 0341 01/11/24 0750 01/11/24 1231  BP: 124/78 129/73 (!) 155/79 (!) 171/86  Pulse: 94 94 93 88  Resp: 18 18 20 20   Temp: 98.4 F (36.9 C) 98.2 F (36.8 C) 97.9 F (36.6 C) 98.3 F (36.8 C)  TempSrc:      SpO2: 92% 95% 97% 95%    Intake/Output Summary (Last 24 hours) at 01/11/2024 1427 Last data filed at 01/11/2024 0900 Gross per 24 hour  Intake 120 ml  Output --  Net 120 ml   There were no vitals filed for this visit.  Data Reviewed: I have personally reviewed and interpreted daily labs, tele strips, imagings as discussed above. I reviewed all nursing notes, pharmacy notes, vitals, pertinent old records I have discussed plan of care as  described above with RN and patient/family.  CBC: Recent Labs  Lab 01/10/24 0716 01/11/24 0259  WBC 7.2 4.8  NEUTROABS 5.0  --   HGB 18.8* 16.3  HCT 55.5* 49.3  MCV 76.7* 77.4*  PLT 159 157   Basic Metabolic Panel: Recent Labs  Lab 01/10/24 0716 01/11/24 0259  NA 131* 135  K 4.0 3.3*  CL 95* 104  CO2 21* 20*  GLUCOSE 187* 115*  BUN 27* 27*  CREATININE 2.67* 1.11  CALCIUM  8.5* 7.8*  MG 1.4* 2.2  PHOS 4.4  --     Studies: No results found.  Scheduled Meds:  acidophilus  2 capsule Oral TID   [START ON 01/12/2024] amLODipine   5 mg Oral q morning   aspirin EC  81 mg Oral Daily   atorvastatin   40 mg Oral QPM   azithromycin  500 mg Oral Daily   heparin  5,000 Units Subcutaneous Q8H   insulin aspart  0-9 Units Subcutaneous TID WC   metoprolol tartrate  50 mg Oral BID   traZODone   50 mg Oral QHS   Continuous Infusions: PRN Meds: acetaminophen  **OR** acetaminophen , hydrALAZINE **OR** hydrALAZINE, HYDROmorphone (DILAUDID) injection, loperamide, melatonin, metoprolol tartrate, ondansetron **OR** ondansetron (ZOFRAN) IV  Time spent: 55 minutes  Author: Althia Atlas. MD [facility] 01/11/2024 2:27 PM  To reach On-call, see care teams to locate the attending and reach out to them via [email or web address]. If 7PM-7AM, please contact night-coverage If you still have difficulty reaching the attending provider, please page the [facility] (Director on Call) for [facility] on amion for assistance.

## 2024-01-11 NOTE — Plan of Care (Signed)

## 2024-01-12 ENCOUNTER — Other Ambulatory Visit (HOSPITAL_COMMUNITY): Payer: Self-pay

## 2024-01-12 ENCOUNTER — Telehealth (HOSPITAL_COMMUNITY): Payer: Self-pay | Admitting: Pharmacy Technician

## 2024-01-12 ENCOUNTER — Inpatient Hospital Stay
Admit: 2024-01-12 | Discharge: 2024-01-12 | Disposition: A | Discharge status: Home / Self Care | Attending: Student | Admitting: Student

## 2024-01-12 DIAGNOSIS — N179 Acute kidney failure, unspecified: Secondary | ICD-10-CM | POA: Diagnosis not present

## 2024-01-12 LAB — BASIC METABOLIC PANEL WITH GFR
Anion gap: 8 (ref 5–15)
BUN: 19 mg/dL (ref 6–20)
CO2: 25 mmol/L (ref 22–32)
Calcium: 8.4 mg/dL — ABNORMAL LOW (ref 8.9–10.3)
Chloride: 103 mmol/L (ref 98–111)
Creatinine, Ser: 0.76 mg/dL (ref 0.61–1.24)
GFR, Estimated: >60 mL/min (ref >60)
Glucose, Bld: 143 mg/dL — ABNORMAL HIGH (ref 70–99)
Potassium: 4 mmol/L (ref 3.5–5.1)
Sodium: 136 mmol/L (ref 135–145)

## 2024-01-12 LAB — ECHOCARDIOGRAM COMPLETE
AR max vel: 2.82 cm2
AV Peak grad: 10.2 mmHg
Ao pk vel: 1.6 m/s
Area-P 1/2: 5.48 cm2
S' Lateral: 3 cm

## 2024-01-12 LAB — CBC
HCT: 50.2 % (ref 39.0–52.0)
Hemoglobin: 16.8 g/dL (ref 13.0–17.0)
MCH: 25.8 pg — ABNORMAL LOW (ref 26.0–34.0)
MCHC: 33.5 g/dL (ref 30.0–36.0)
MCV: 77.1 fL — ABNORMAL LOW (ref 80.0–100.0)
Platelets: 217 10*3/uL (ref 150–400)
RBC: 6.51 MIL/uL — ABNORMAL HIGH (ref 4.22–5.81)
RDW: 15.1 % (ref 11.5–15.5)
WBC: 7.1 10*3/uL (ref 4.0–10.5)
nRBC: 0 % (ref 0.0–0.2)

## 2024-01-12 LAB — GLUCOSE, CAPILLARY
Glucose-Capillary: 150 mg/dL — ABNORMAL HIGH (ref 70–99)
Glucose-Capillary: 170 mg/dL — ABNORMAL HIGH (ref 70–99)
Glucose-Capillary: 164 mg/dL — ABNORMAL HIGH (ref 70–99)
Glucose-Capillary: 179 mg/dL — ABNORMAL HIGH (ref 70–99)

## 2024-01-12 LAB — HEPATIC FUNCTION PANEL
ALT: 235 U/L — ABNORMAL HIGH (ref 0–44)
AST: 33 U/L (ref 15–41)
Albumin: 3.2 g/dL — ABNORMAL LOW (ref 3.5–5.0)
Alkaline Phosphatase: 36 U/L — ABNORMAL LOW (ref 38–126)
Bilirubin, Direct: 0.5 mg/dL — ABNORMAL HIGH (ref 0.0–0.2)
Indirect Bilirubin: 1.1 mg/dL — ABNORMAL HIGH (ref 0.3–0.9)
Total Bilirubin: 1.6 mg/dL — ABNORMAL HIGH (ref 0.0–1.2)
Total Protein: 6.7 g/dL (ref 6.5–8.1)

## 2024-01-12 LAB — OVA + PARASITE EXAM

## 2024-01-12 LAB — MAGNESIUM: Magnesium: 2.4 mg/dL (ref 1.7–2.4)

## 2024-01-12 LAB — PHOSPHORUS: Phosphorus: 2.5 mg/dL (ref 2.5–4.6)

## 2024-01-12 LAB — O&P RESULT

## 2024-01-12 LAB — VITAMIN B12: Vitamin B-12: 1307 pg/mL — ABNORMAL HIGH (ref 180–914)

## 2024-01-12 MED ORDER — VITAMIN D (ERGOCALCIFEROL) 1.25 MG (50000 UNIT) PO CAPS
50000.0000 [IU] | ORAL_CAPSULE | ORAL | Status: DC
  Administered 2024-01-12: 50000 [IU] via ORAL
  Filled 2024-01-12 (×2): qty 1

## 2024-01-12 MED ORDER — ACETAMINOPHEN 650 MG RE SUPP: 650.0000 mg | Freq: Four times a day (QID) | RECTAL | Status: DC | PRN

## 2024-01-12 MED ORDER — ACETAMINOPHEN 325 MG PO TABS
650.0000 mg | ORAL_TABLET | Freq: Four times a day (QID) | ORAL | Status: DC | PRN
  Administered 2024-01-13: 650 mg via ORAL
  Filled 2024-01-12: qty 2

## 2024-01-12 MED ORDER — DILTIAZEM HCL 30 MG PO TABS
60.0000 mg | ORAL_TABLET | Freq: Four times a day (QID) | ORAL | Status: DC
  Administered 2024-01-12 – 2024-01-13 (×4): 60 mg via ORAL
  Filled 2024-01-12 (×4): qty 2

## 2024-01-12 MED ORDER — OXYCODONE HCL 5 MG PO TABS
5.0000 mg | ORAL_TABLET | Freq: Four times a day (QID) | ORAL | Status: DC | PRN
  Administered 2024-01-12: 5 mg via ORAL
  Filled 2024-01-12: qty 1

## 2024-01-12 MED ORDER — DICYCLOMINE HCL 10 MG PO CAPS: 10.0000 mg | ORAL_CAPSULE | Freq: Three times a day (TID) | ORAL | Status: DC | PRN

## 2024-01-12 MED ORDER — CALCIUM CARBONATE ANTACID 500 MG PO CHEW: 1.0000 | CHEWABLE_TABLET | Freq: Three times a day (TID) | ORAL | Status: DC | PRN

## 2024-01-12 NOTE — Progress Notes (Signed)
 Echocardiogram 2D Echocardiogram has been performed.  Steven Mcintosh 01/12/2024, 6:09 PM

## 2024-01-12 NOTE — Progress Notes (Addendum)
 [facility] Progress Note  Patient: Steven Mcintosh    URK:[id number]  DOA: 01/10/2024     Date of Service: the patient was seen and examined on 01/12/2024  Chief Complaint  Patient presents with   Nausea    N/D   Brief hospital course: LUISALBERTO BEEGLE is a 53 y.o. male with medical history significant of IIDM, HTN, HLD, fatty liver, hepatitis C s/p treatment, PAF, morbid obesity, presented with worsening symptoms of persistent diarrhea, nauseous vomiting whole body ache.  Patient was found to have traveler's diarrhea, GI pathogen positive for enterotoxigenic E. Coli Patient was started on azithromycin Further management as below.   Assessment and Plan:  # Sepsis secondary to acute gastroenteritis caused by E. Coli   GI pathogen positive for ETEC S/p azithromycin 500 mg p.o. daily x 3 days Blood culture NGTD   # Transaminitis, repeat due to sepsis US  RUQ fatty liver, no any other findings Trend LFTs  # Paroxysmal A-fib with RVR, acute onset, resolved, converted back to sinus rhythm, S/p IV Lopressor and digoxin given on 6/15 Continue to monitor on telemetry Started aspirin 81 mg p.o. daily 6/16 increased Lopressor 75 mg p.o. twice daily Started Eliquis 5 mg p.o. twice daily S/p Cardizem IV infusion, started on 6/16, stopped on 6/17 6/17 Cardizem IR 60 mg p.o. every 6 hourly Follow 2D echocardiogram Follow cardiology for further recommendation  # AKI with oliguria most likely due to dehydration secondary to acute gastroenteritis Renal sonogram ruled out any significant findings S/p IV fluid, AKI resolved Creatinine 1.11 Mild metabolic acidosis, monitor chemistry  # Hypokalemia, potassium repleted. # Hypomagnesemia, resolved  # HTN Blood pressure was low due to sepsis so held home medications Started Lopressor due to paroxysmal A-fib with RVR BP now elevated Continue monitoring use hydralazine    # IIDM -Hold o all p.o. diabetic medications - SSI  # Vitamin  D deficiency: started vitamin D 50,000 units p.o. weekly, follow with PCP to repeat vitamin D level after 3 to 6 months.   There is no height or weight on file to calculate BMI.  Interventions:  Diet: Carb modified diet DVT Prophylaxis: Subcutaneous Heparin    Advance goals of care discussion: Full code  Family Communication: family was present at bedside, at the time of interview.  The pt provided permission to discuss medical plan with the family. Opportunity was given to ask question and all questions were answered satisfactorily.   Disposition:  Pt is from home, admitted with traveler's diarrhea, still has slight imbalance, which precludes a safe discharge. Discharge to home, when stable, most likely tomorrow a.m.  Subjective: No significant events overnight, patient had diarrhea 6 episodes yesterday, no diarrhea today.  Denied any chest pain or palpitations, heart rate is well-controlled now   Physical Exam: General: NAD, lying comfortably Appear in no distress, affect appropriate Eyes: PERRLA ENT: Oral Mucosa Clear, moist  Neck: no JVD,  Cardiovascular: S1 and S2 Present, no Murmur,  Respiratory: good respiratory effort, Bilateral Air entry equal and Decreased, no Crackles, no wheezes Abdomen: BS present, Soft, obese, mild lower abdominal tenderness Skin: no rashes Extremities: no Pedal edema, no calf tenderness Neurologic: without any new focal findings Gait not checked due to patient safety concerns  Vitals:   01/12/24 1500 01/12/24 1510 01/12/24 1605 01/12/24 1618  BP:      Pulse:      Resp:   20 20  Temp: 98 F (36.7 C) 98 F (36.7 C)  TempSrc:  Oral    SpO2:        Intake/Output Summary (Last 24 hours) at 01/12/2024 1641 Last data filed at 01/12/2024 1500 Gross per 24 hour  Intake 463.68 ml  Output --  Net 463.68 ml   There were no vitals filed for this visit.  Data Reviewed: I have personally reviewed and interpreted daily labs, tele strips,  imagings as discussed above. I reviewed all nursing notes, pharmacy notes, vitals, pertinent old records I have discussed plan of care as described above with RN and patient/family.  CBC: Recent Labs  Lab 01/10/24 0716 01/11/24 0259 01/12/24 0519  WBC 7.2 4.8 7.1  NEUTROABS 5.0  --   --   HGB 18.8* 16.3 16.8  HCT 55.5* 49.3 50.2  MCV 76.7* 77.4* 77.1*  PLT 159 157 217   Basic Metabolic Panel: Recent Labs  Lab 01/10/24 0716 01/11/24 0259 01/12/24 0519  NA 131* 135 136  K 4.0 3.3* 4.0  CL 95* 104 103  CO2 21* 20* 25  GLUCOSE 187* 115* 143*  BUN 27* 27* 19  CREATININE 2.67* 1.11 0.76  CALCIUM  8.5* 7.8* 8.4*  MG 1.4* 2.2 2.4  PHOS 4.4  --  2.5    Studies: No results found.  Scheduled Meds:  acidophilus  2 capsule Oral TID   apixaban  5 mg Oral BID   aspirin EC  81 mg Oral Daily   atorvastatin   40 mg Oral QPM   diltiazem  60 mg Oral Q6H   insulin aspart  0-9 Units Subcutaneous TID WC   metoprolol tartrate  75 mg Oral BID   [START ON 01/13/2024] pantoprazole  40 mg Oral Daily   traZODone   50 mg Oral QHS   Vitamin D (Ergocalciferol)  50,000 Units Oral Q7 days   Continuous Infusions: PRN Meds: acetaminophen  **OR** acetaminophen , hydrALAZINE **OR** hydrALAZINE, HYDROmorphone (DILAUDID) injection, loperamide, melatonin, metoprolol tartrate, ondansetron **OR** ondansetron (ZOFRAN) IV, oxyCODONE  Time spent: 40 minutes  Author: Althia Atlas. MD [facility] 01/12/2024 4:41 PM  To reach On-call, see care teams to locate the attending and reach out to them via [email or web address]. If 7PM-7AM, please contact night-coverage If you still have difficulty reaching the attending provider, please page the [facility] (Director on Call) for [facility] on amion for assistance.

## 2024-01-12 NOTE — Telephone Encounter (Signed)
Patient Product/process development scientist completed.    The patient is insured through [facility] MEDICAID.     Ran test claim for Eliquis 5 mg and the current 30 day co-pay is $4.00.  Ran test claim for Xarelto 20 mg and the current 30 day co-pay is $4.00.  This test claim was processed through [facility]- copay amounts may vary at other pharmacies due to pharmacy/plan contracts, or as the patient moves through the different stages of their insurance plan.     Roland Earl, CPHT Pharmacy Technician III Certified Patient Advocate [facility] Patient Advocate Team Direct Number: [phone]  Fax: [phone]

## 2024-01-12 NOTE — TOC CM/SW Note (Signed)
 Transition of Care [facility]) - Inpatient Brief Assessment   Patient Details  Name: Steven Mcintosh MRN: [id number] Date of Birth: 20-Nov-1970  [facility] of [facility]) CM/SW Contact:    Odilia Bennett, LCSW Phone Number: 01/12/2024, 10:15 AM   Clinical Narrative: CSW reviewed chart. No TOC needs identified so far. CSW will continue to follow progress. Please place [facility] consult if any needs arise.  Transition of Care Asessment: Insurance and Status: Insurance coverage has been reviewed Patient has primary care physician: Yes Home environment has been reviewed: Single family home Prior level of function:: Not documented Prior/Current Home Services: No current home services Social Drivers of Health Review: SDOH reviewed no interventions necessary Readmission risk has been reviewed: Yes Transition of care needs: no transition of care needs at this time

## 2024-01-12 NOTE — Progress Notes (Signed)
 [facility] CLINIC CARDIOLOGY PROGRESS NOTE       Patient ID: Steven Mcintosh MRN: [id number] DOB/AGE: 09-19-1970 53 y.o.  Admit date: 01/10/2024 Referring Physician Dr. Hubert Madden Primary Physician Shari Daughters, MD Primary Cardiologist None Reason for Consultation paroxysmal atrial fibrillation  HPI: Steven Mcintosh is a 53 y.o. male  with a past medical history of hypertension, hyperlipidemia, IIDM, fatty liver, hepatitis C, obesity who presented to the ED on 01/10/2024 for persistent nausea, vomiting, diarrhea and generalized bodyaches.  On 06/15 around 1700 telemetry showed patient was in atrial fibrillation with RVR rates in the 140s.  Patient converted into sinus rhythm s/p 1 dose of IV metoprolol tartrate 5 mg and 1 dose of IV digoxin 0.125.  Patient reports no known history of paroxysmal atrial fibrillation.  Cardiology was consulted for further evaluation.   Interval History: -Patient seen and examined this AM and laying comfortably in hospital bed. Patient states he feels fine and continues to deny any chest pain, palpitations, SOB or lightheadedness.  -Patients BP stable and HR improving this AM. Per tele in AF with improved rates 80-90s.                    -This afternoon (06/17) pt converted to SR 70s on IV dilt.  -Patient remains on room air with stable SpO2.   Review of systems complete and found to be negative unless listed above    Past Medical History:  Diagnosis Date   Diabetes mellitus without complication (HCC)    Hypercholesteremia unk   Hypertension     Past Surgical History:  Procedure Laterality Date   DIAPHRAGM SURGERY     ENUCLEATION Left     Medications Prior to Admission  Medication Sig Dispense Refill Last Dose/Taking   amLODipine  (NORVASC ) 5 MG tablet Take 1 tablet (5 mg total) by mouth every morning. 30 tablet 0 Past Week   atorvastatin  (LIPITOR) 40 MG tablet TAKE 1 TABLET BY MOUTH ONCE DAILY IN THE EVENING 90 tablet 0 Past Week   carvedilol  (COREG )  25 MG tablet Take 2 tablets by mouth twice daily 120 tablet 3 01/09/2024 Bedtime   cyclobenzaprine  (FLEXERIL ) 10 MG tablet TAKE 1 TABLET BY MOUTH THREE TIMES DAILY AS NEEDED FOR  NECK  PAIN 90 tablet 0 Taking   ibuprofen  (ADVIL ) 800 MG tablet Take 1 tablet (800 mg total) by mouth 3 (three) times daily as needed. 90 tablet 0 Taking As Needed   INVOKANA  300 MG TABS tablet Take 1 tablet (300 mg total) by mouth every morning. 90 tablet 0 Past Week   metFORMIN  (GLUCOPHAGE -XR) 500 MG 24 hr tablet Take 1 tablet (500 mg total) by mouth daily with breakfast. 360 tablet 0 Past Week   OZEMPIC , 2 MG/DOSE, 8 MG/3ML SOPN INJECT 2MG   SUBCUTANEOUSLY ONCE A WEEK 3 mL 2 Past Month   sildenafil  (VIAGRA ) 100 MG tablet Take 1 tablet (100 mg total) by mouth as needed for erectile dysfunction. 15 tablet 2 Taking As Needed   traZODone  (DESYREL ) 50 MG tablet Take 1 tablet (50 mg total) by mouth at bedtime. 30 tablet 1 01/09/2024   valsartan  (DIOVAN ) 320 MG tablet Take 1 tablet (320 mg total) by mouth every morning. 30 tablet 0 Past Week   Intimacy Products (EROXON) GEL Apply 1 Package topically as directed for 4 doses. Apply one tube to glans penis prior to intercourse 1 each 1    pioglitazone  (ACTOS ) 15 MG tablet Take 1 tablet (15 mg total) by mouth  daily. (Patient not taking: Reported on 01/10/2024) 90 tablet 0 Not Taking   Social History   Socioeconomic History   Marital status: Single    Spouse name: Not on file   Number of children: Not on file   Years of education: Not on file   Highest education level: Not on file  Occupational History   Not on file  Tobacco Use   Smoking status: Every Day   Smokeless tobacco: Not on file  Substance and Sexual Activity   Alcohol use: Yes   Drug use: Not Currently   Sexual activity: Yes  Other Topics Concern   Not on file  Social History Narrative   Not on file   Social Drivers of Health   Financial Resource Strain: Not on file  Food Insecurity: Not on file   Transportation Needs: Not on file  Physical Activity: Not on file  Stress: Not on file  Social Connections: Not on file  Intimate Partner Violence: Not on file    History reviewed. No pertinent family history.   Vitals:   01/12/24 0700 01/12/24 0741 01/12/24 0800 01/12/24 0840  BP: 124/68  102/76 114/72  Pulse: 92 97 81   Resp: 19 18 14    Temp:    98 F (36.7 C)  TempSrc:    Oral  SpO2: 94% 93% 91%     PHYSICAL EXAM General: Well-appearing male, well nourished, in no acute distress. HEENT: Normocephalic and atraumatic. Neck: No JVD.   Lungs: Normal respiratory effort on room air.  Diminished breath sounds bilaterally Heart: HRRR Normal S1 and S2 without gallops or murmurs.  Abdomen: Non-distended appearing.  Msk: Normal strength and tone for age. Extremities: Warm and well perfused. No clubbing, cyanosis, edema.  Neuro: Alert and oriented X 3. Psych: Answers questions appropriately.   Labs: Basic Metabolic Panel: Recent Labs    01/10/24 0716 01/11/24 0259 01/12/24 0519  NA 131* 135 136  K 4.0 3.3* 4.0  CL 95* 104 103  CO2 21* 20* 25  GLUCOSE 187* 115* 143*  BUN 27* 27* 19  CREATININE 2.67* 1.11 0.76  CALCIUM  8.5* 7.8* 8.4*  MG 1.4* 2.2 2.4  PHOS 4.4  --  2.5   Liver Function Tests: Recent Labs    01/11/24 0259 01/12/24 0519  AST 156* 33  ALT 432* 235*  ALKPHOS 34* 36*  BILITOT 2.6* 1.6*  PROT 6.7 6.7  ALBUMIN 3.2* 3.2*   Recent Labs    01/10/24 0716  LIPASE 26   CBC: Recent Labs    01/10/24 0716 01/11/24 0259 01/12/24 0519  WBC 7.2 4.8 7.1  NEUTROABS 5.0  --   --   HGB 18.8* 16.3 16.8  HCT 55.5* 49.3 50.2  MCV 76.7* 77.4* 77.1*  PLT 159 157 217   Cardiac Enzymes: Recent Labs    01/10/24 0716  CKTOTAL 102   BNP: No results for input(s): BNP in the last 72 hours. D-Dimer: No results for input(s): DDIMER in the last 72 hours. Hemoglobin A1C: No results for input(s): HGBA1C in the last 72 hours. Fasting Lipid Panel: No  results for input(s): CHOL, HDL, LDLCALC, TRIG, CHOLHDL, LDLDIRECT in the last 72 hours. Thyroid Function Tests: No results for input(s): TSH, T4TOTAL, T3FREE, THYROIDAB in the last 72 hours.  Invalid input(s): FREET3 Anemia Panel: Recent Labs    01/11/24 1039  TIBC 283  IRON 56     Radiology: US  Abdomen Limited RUQ (LIVER/GB) Result Date: 01/10/2024 CLINICAL DATA:  [id number] AKI (acute kidney  injury) [facility]) [id number]; [phone] LFTs abnormal [id number] EXAM: ULTRASOUND ABDOMEN LIMITED RIGHT UPPER QUADRANT COMPARISON:  None Available. FINDINGS: Gallbladder: No gallstones, pericholecystic fluid or wall thickening visualized. Common bile duct: Diameter: 0.3 cm. Liver: Liver is hyperechoic consistent with fatty infiltration. No focal hepatic lesions identified. No intrahepatic ductal dilatation. Hepatopetal portal vein flow. IMPRESSION: Hepatic fatty infiltration. Otherwise unremarkable examination of the right upper quadrant. EXAM: RENAL / URINARY TRACT ULTRASOUND COMPLETE COMPARISON:  None Available. FINDINGS: The right kidney measured 12.5 cm and the left kidney measured 13.6 cm. The kidneys demonstrate normal echogenicity. No renal parenchymal lesions are identified. No shadowing stones are seen. No hydronephrosis. Bladder: Empty and not evaluable. IMPRESSION: Unremarkable renal ultrasound. Electronically Signed   By: Sydell Eva M.D.   On: 01/10/2024 10:29   US  RENAL Result Date: 01/10/2024 CLINICAL DATA:  [id number] AKI (acute kidney injury) (HCC) [id number]; [id number] LFTs abnormal [id number] EXAM: ULTRASOUND ABDOMEN LIMITED RIGHT UPPER QUADRANT COMPARISON:  None Available. FINDINGS: Gallbladder: No gallstones, pericholecystic fluid or wall thickening visualized. Common bile duct: Diameter: 0.3 cm. Liver: Liver is hyperechoic consistent with fatty infiltration. No focal hepatic lesions identified. No intrahepatic ductal dilatation. Hepatopetal portal vein flow. IMPRESSION: Hepatic fatty  infiltration. Otherwise unremarkable examination of the right upper quadrant. EXAM: RENAL / URINARY TRACT ULTRASOUND COMPLETE COMPARISON:  None Available. FINDINGS: The right kidney measured 12.5 cm and the left kidney measured 13.6 cm. The kidneys demonstrate normal echogenicity. No renal parenchymal lesions are identified. No shadowing stones are seen. No hydronephrosis. Bladder: Empty and not evaluable. IMPRESSION: Unremarkable renal ultrasound. Electronically Signed   By: Sydell Eva M.D.   On: 01/10/2024 10:29    ECHO ordered.  TELEMETRY reviewed by me 01/12/2024: AF 70-80s (converted to SR in 70s 06/16)  EKG reviewed by me: Sinus tachycardia rate 104 bpm  Data reviewed by me 01/12/2024: last 24h vitals tele labs imaging I/O hospitalist progress note  Principal Problem:   AKI (acute kidney injury) (HCC) Active Problems:   Diarrhea   LFT elevation    ASSESSMENT AND PLAN:  LIMUEL NIEBLAS is a 53 y.o. male  with a past medical history of hypertension, hyperlipidemia, IIDM, fatty liver, hepatitis C, obesity who presented to the ED on 01/10/2024 for persistent nausea, vomiting, diarrhea and generalized bodyaches.  On 06/15 around 1700 telemetry showed patient was in atrial fibrillation with RVR rates in the 140s.  Patient converted into sinus rhythm s/p 1 dose of IV metoprolol tartrate 5 mg and 1 dose of IV digoxin 0.125.  Patient reports no known history of paroxysmal atrial fibrillation.  Cardiology was consulted for further evaluation.   # Sepsis secondary to acute gastroenteritis by E Coli. # History of hepatitis C, fatty liver # Atrial fibrillation RVR # Paroxsymal atrial fibrillation # Hypertension # Hyperlipidemia Patient with no known history of atrial fibrillation presents with sxs of gastroenteritis. Tele on 06/15 at around 1700 revealed atrial fibrillation RVR rate in 140s. Patient converted to SR s/p 1x IV metoprolol tartrate 5 mg and 1x IV digoxin 0.125 mg. Per tele patient  converted to SR on 06/16 with rate 70s. -Echo ordered. -Monitor and replenish electrolytes for a goal K >4, Mag >2  -Continue metoprolol tartrate to 75 mg twice daily. -Transition IV to PO Cardizem 60 mg every 6 hrs. Overlap Dilt infusion for 1-2 hours after first PO dose.  -CHA2DS2VASc score of 2 (in the setting of HTN, diabetes history). Would recommend starting anticoagulation for stroke risk reduction. Patient denies any recent falls, or  prior major bleeding.  -Continue Eliquis 5 mg twice daily for stroke risk reduction.  -Continue aspirin 81 mg, atorvastatin  40 mg daily.   This patient's plan of care was discussed and created with Dr. Beau Bound and he is in agreement.  Signed: Creighton Doffing, PA-C  01/12/2024, 11:07 AM [facility]

## 2024-01-12 NOTE — Plan of Care (Signed)

## 2024-01-13 DIAGNOSIS — N179 Acute kidney failure, unspecified: Secondary | ICD-10-CM | POA: Diagnosis not present

## 2024-01-13 LAB — GLUCOSE, CAPILLARY
Glucose-Capillary: 145 mg/dL — ABNORMAL HIGH (ref 70–99)
Glucose-Capillary: 159 mg/dL — ABNORMAL HIGH (ref 70–99)
Glucose-Capillary: 147 mg/dL — ABNORMAL HIGH (ref 70–99)
Glucose-Capillary: 148 mg/dL — ABNORMAL HIGH (ref 70–99)

## 2024-01-13 LAB — BASIC METABOLIC PANEL WITH GFR
Anion gap: 12 (ref 5–15)
BUN: 19 mg/dL (ref 6–20)
CO2: 26 mmol/L (ref 22–32)
Calcium: 8.7 mg/dL — ABNORMAL LOW (ref 8.9–10.3)
Chloride: 98 mmol/L (ref 98–111)
Creatinine, Ser: 1.02 mg/dL (ref 0.61–1.24)
GFR, Estimated: >60 mL/min (ref >60)
Glucose, Bld: 142 mg/dL — ABNORMAL HIGH (ref 70–99)
Potassium: 4.1 mmol/L (ref 3.5–5.1)
Sodium: 136 mmol/L (ref 135–145)

## 2024-01-13 LAB — CBC
HCT: 55.1 % — ABNORMAL HIGH (ref 39.0–52.0)
Hemoglobin: 18.2 g/dL — ABNORMAL HIGH (ref 13.0–17.0)
MCH: 25.8 pg — ABNORMAL LOW (ref 26.0–34.0)
MCHC: 33 g/dL (ref 30.0–36.0)
MCV: 78 fL — ABNORMAL LOW (ref 80.0–100.0)
Platelets: 232 10*3/uL (ref 150–400)
RBC: 7.06 MIL/uL — ABNORMAL HIGH (ref 4.22–5.81)
RDW: 15.7 % — ABNORMAL HIGH (ref 11.5–15.5)
WBC: 9.4 10*3/uL (ref 4.0–10.5)
nRBC: 0 % (ref 0.0–0.2)

## 2024-01-13 LAB — HEPATIC FUNCTION PANEL
ALT: 172 U/L — ABNORMAL HIGH (ref 0–44)
AST: 32 U/L (ref 15–41)
Albumin: 3.4 g/dL — ABNORMAL LOW (ref 3.5–5.0)
Alkaline Phosphatase: 39 U/L (ref 38–126)
Bilirubin, Direct: 0.5 mg/dL — ABNORMAL HIGH (ref 0.0–0.2)
Indirect Bilirubin: 1.2 mg/dL — ABNORMAL HIGH (ref 0.3–0.9)
Total Bilirubin: 1.7 mg/dL — ABNORMAL HIGH (ref 0.0–1.2)
Total Protein: 7 g/dL (ref 6.5–8.1)

## 2024-01-13 LAB — PHOSPHORUS: Phosphorus: 3.8 mg/dL (ref 2.5–4.6)

## 2024-01-13 LAB — MAGNESIUM: Magnesium: 1.9 mg/dL (ref 1.7–2.4)

## 2024-01-13 MED ORDER — AMIODARONE HCL IN DEXTROSE 360-4.14 MG/200ML-% IV SOLN
30.0000 mg/h | INTRAVENOUS | Status: AC
  Administered 2024-01-13: 30 mg/h via INTRAVENOUS
  Filled 2024-01-13: qty 200

## 2024-01-13 MED ORDER — AMIODARONE HCL IN DEXTROSE 360-4.14 MG/200ML-% IV SOLN
60.0000 mg/h | INTRAVENOUS | Status: AC
  Administered 2024-01-13 (×2): 60 mg/h via INTRAVENOUS
  Filled 2024-01-13 (×2): qty 200

## 2024-01-13 MED ORDER — DILTIAZEM HCL ER COATED BEADS 180 MG PO CP24
180.0000 mg | ORAL_CAPSULE | Freq: Every day | ORAL | Status: DC
  Administered 2024-01-13: 180 mg via ORAL
  Filled 2024-01-13: qty 1

## 2024-01-13 MED ORDER — DILTIAZEM HCL ER COATED BEADS 120 MG PO CP24
240.0000 mg | ORAL_CAPSULE | Freq: Every day | ORAL | Status: DC
  Filled 2024-01-13: qty 2

## 2024-01-13 MED ORDER — DILTIAZEM HCL ER COATED BEADS 180 MG PO CP24
180.0000 mg | ORAL_CAPSULE | Freq: Once | ORAL | Status: AC
  Administered 2024-01-13: 180 mg via ORAL
  Filled 2024-01-13: qty 1

## 2024-01-13 MED ORDER — AMIODARONE LOAD VIA INFUSION
150.0000 mg | Freq: Once | INTRAVENOUS | Status: AC
  Administered 2024-01-13: 150 mg via INTRAVENOUS
  Filled 2024-01-13: qty 83.34

## 2024-01-13 NOTE — Progress Notes (Cosign Needed Addendum)
 [facility] CLINIC CARDIOLOGY PROGRESS NOTE       Patient ID: Steven Mcintosh MRN: [id number] DOB/AGE: October 07, 1970 53 y.o.  Admit date: 01/10/2024 Referring Physician Dr. Hubert Madden Primary Physician Shari Daughters, MD Primary Cardiologist None Reason for Consultation paroxysmal atrial fibrillation  HPI: Steven Mcintosh is a 53 y.o. male  with a past medical history of hypertension, hyperlipidemia, IIDM, fatty liver, hepatitis C, obesity who presented to the ED on 01/10/2024 for persistent nausea, vomiting, diarrhea and generalized bodyaches.  On 06/15 around 1700 telemetry showed patient was in atrial fibrillation with RVR rates in the 140s.  Patient converted into sinus rhythm s/p 1 dose of IV metoprolol tartrate 5 mg and 1 dose of IV digoxin 0.125.  Patient reports no known history of paroxysmal atrial fibrillation.  Cardiology was consulted for further evaluation.   Interval History: -Patient seen and examined this AM and laying comfortably in hospital bed. Patient states he feels fine and reports intermittent palpitations. Continues to deny any chest pain, SOB or lightheadedness.  -Patients BP stable and HR uncontrolled this AM. Per tele is in/out of AF rates 90-100s -Patient remains on room air with stable SpO2.   Review of systems complete and found to be negative unless listed above    Past Medical History:  Diagnosis Date   Diabetes mellitus without complication (HCC)    Hypercholesteremia unk   Hypertension     Past Surgical History:  Procedure Laterality Date   DIAPHRAGM SURGERY     ENUCLEATION Left     Medications Prior to Admission  Medication Sig Dispense Refill Last Dose/Taking   amLODipine  (NORVASC ) 5 MG tablet Take 1 tablet (5 mg total) by mouth every morning. 30 tablet 0 Past Week   atorvastatin  (LIPITOR) 40 MG tablet TAKE 1 TABLET BY MOUTH ONCE DAILY IN THE EVENING 90 tablet 0 Past Week   carvedilol  (COREG ) 25 MG tablet Take 2 tablets by mouth twice daily 120 tablet 3  01/09/2024 Bedtime   cyclobenzaprine  (FLEXERIL ) 10 MG tablet TAKE 1 TABLET BY MOUTH THREE TIMES DAILY AS NEEDED FOR  NECK  PAIN 90 tablet 0 Taking   ibuprofen  (ADVIL ) 800 MG tablet Take 1 tablet (800 mg total) by mouth 3 (three) times daily as needed. 90 tablet 0 Taking As Needed   INVOKANA  300 MG TABS tablet Take 1 tablet (300 mg total) by mouth every morning. 90 tablet 0 Past Week   metFORMIN  (GLUCOPHAGE -XR) 500 MG 24 hr tablet Take 1 tablet (500 mg total) by mouth daily with breakfast. 360 tablet 0 Past Week   OZEMPIC , 2 MG/DOSE, 8 MG/3ML SOPN INJECT 2MG   SUBCUTANEOUSLY ONCE A WEEK 3 mL 2 Past Month   sildenafil  (VIAGRA ) 100 MG tablet Take 1 tablet (100 mg total) by mouth as needed for erectile dysfunction. 15 tablet 2 Taking As Needed   traZODone  (DESYREL ) 50 MG tablet Take 1 tablet (50 mg total) by mouth at bedtime. 30 tablet 1 01/09/2024   valsartan  (DIOVAN ) 320 MG tablet Take 1 tablet (320 mg total) by mouth every morning. 30 tablet 0 Past Week   Intimacy Products (EROXON) GEL Apply 1 Package topically as directed for 4 doses. Apply one tube to glans penis prior to intercourse 1 each 1    pioglitazone  (ACTOS ) 15 MG tablet Take 1 tablet (15 mg total) by mouth daily. (Patient not taking: Reported on 01/10/2024) 90 tablet 0 Not Taking   Social History   Socioeconomic History   Marital status: Single    Spouse  name: Not on file   Number of children: Not on file   Years of education: Not on file   Highest education level: Not on file  Occupational History   Not on file  Tobacco Use   Smoking status: Every Day   Smokeless tobacco: Not on file  Substance and Sexual Activity   Alcohol use: Yes   Drug use: Not Currently   Sexual activity: Yes  Other Topics Concern   Not on file  Social History Narrative   Not on file   Social Drivers of Health   Financial Resource Strain: Not on file  Food Insecurity: Not on file  Transportation Needs: Not on file  Physical Activity: Not on file   Stress: Not on file  Social Connections: Not on file  Intimate Partner Violence: Not on file    History reviewed. No pertinent family history.   Vitals:   01/12/24 2200 01/13/24 0013 01/13/24 0410 01/13/24 0832  BP:  (!) 168/99 132/76 (!) 157/92  Pulse: (!) 102 79 87 90  Resp:  19 (!) 22   Temp:  97.8 F (36.6 C) 98.6 F (37 C) 98.3 F (36.8 C)  TempSrc:  Oral Oral   SpO2:  96% 95% 94%    PHYSICAL EXAM General: Well-appearing male, well nourished, in no acute distress. HEENT: Normocephalic and atraumatic. Neck: No JVD.   Lungs: Normal respiratory effort on room air.  Diminished breath sounds bilaterally Heart: HRRR Normal S1 and S2 without gallops or murmurs.  Abdomen: Non-distended appearing.  Msk: Normal strength and tone for age. Extremities: Warm and well perfused. No clubbing, cyanosis, edema.  Neuro: Alert and oriented X 3. Psych: Answers questions appropriately.   Labs: Basic Metabolic Panel: Recent Labs    01/12/24 0519 01/13/24 0544  NA 136 136  K 4.0 4.1  CL 103 98  CO2 25 26  GLUCOSE 143* 142*  BUN 19 19  CREATININE 0.76 1.02  CALCIUM  8.4* 8.7*  MG 2.4 1.9  PHOS 2.5 3.8   Liver Function Tests: Recent Labs    01/12/24 0519 01/13/24 0544  AST 33 32  ALT 235* 172*  ALKPHOS 36* 39  BILITOT 1.6* 1.7*  PROT 6.7 7.0  ALBUMIN 3.2* 3.4*   No results for input(s): LIPASE, AMYLASE in the last 72 hours.  CBC: Recent Labs    01/12/24 0519 01/13/24 0544  WBC 7.1 9.4  HGB 16.8 18.2*  HCT 50.2 55.1*  MCV 77.1* 78.0*  PLT 217 232   Cardiac Enzymes: No results for input(s): CKTOTAL, CKMB, CKMBINDEX, TROPONINIHS in the last 72 hours.  BNP: No results for input(s): BNP in the last 72 hours. D-Dimer: No results for input(s): DDIMER in the last 72 hours. Hemoglobin A1C: No results for input(s): HGBA1C in the last 72 hours. Fasting Lipid Panel: No results for input(s): CHOL, HDL, LDLCALC, TRIG, CHOLHDL, LDLDIRECT  in the last 72 hours. Thyroid Function Tests: No results for input(s): TSH, T4TOTAL, T3FREE, THYROIDAB in the last 72 hours.  Invalid input(s): FREET3 Anemia Panel: Recent Labs    01/11/24 1038 01/11/24 1039  VITAMINB12 1,307*  --   TIBC  --  283  IRON  --  56     Radiology: ECHOCARDIOGRAM COMPLETE Result Date: 01/12/2024    ECHOCARDIOGRAM REPORT   Patient Name:   Adella Honey Date of Exam: 01/12/2024 Medical Rec #:  [id number]      Height:       66.5 in Accession #:    [id number]  Weight:       257.8 lb Date of Birth:  06-19-1971      BSA:          2.240 m Patient Age:    53 years       BP:           114/72 mmHg Patient Gender: M              HR:           131 bpm. Exam Location:  [facility] Procedure: 2D Echo, Cardiac Doppler and Color Doppler (Both Spectral and Color            Flow Doppler were utilized during procedure). Indications:     Atrial Fibrillation I48.91  History:         Patient has no prior history of Echocardiogram examinations.                  Risk Factors:Hypertension, Diabetes and Dyslipidemia.  Sonographer:     Terrilee Few RCS Referring Phys:  [id number] Althia Atlas Diagnosing Phys: Dwayne D Callwood MD IMPRESSIONS  1. Left ventricular ejection fraction, by estimation, is 65 to 70%. The left ventricle has normal function. The left ventricle has no regional wall motion abnormalities. Left ventricular diastolic parameters are consistent with Grade II diastolic dysfunction (pseudonormalization).  2. Right ventricular systolic function is normal. The right ventricular size is normal.  3. The mitral valve is normal in structure. No evidence of mitral valve regurgitation.  4. The aortic valve is normal in structure. Aortic valve regurgitation is not visualized. FINDINGS  Left Ventricle: Left ventricular ejection fraction, by estimation, is 65 to 70%. The left ventricle has normal function. The left ventricle has no regional wall motion abnormalities. Global longitudinal  strain performed but not reported based on interpreter judgement due to suboptimal tracking. The left ventricular internal cavity size was normal in size. There is no left ventricular hypertrophy. Left ventricular diastolic parameters are consistent with Grade II diastolic dysfunction (pseudonormalization). Right Ventricle: The right ventricular size is normal. No increase in right ventricular wall thickness. Right ventricular systolic function is normal. Left Atrium: Left atrial size was normal in size. Right Atrium: Right atrial size was normal in size. Pericardium: There is no evidence of pericardial effusion. Mitral Valve: The mitral valve is normal in structure. No evidence of mitral valve regurgitation. Tricuspid Valve: The tricuspid valve is normal in structure. Tricuspid valve regurgitation is trivial. Aortic Valve: The aortic valve is normal in structure. Aortic valve regurgitation is not visualized. Aortic valve peak gradient measures 10.2 mmHg. Pulmonic Valve: The pulmonic valve was normal in structure. Pulmonic valve regurgitation is not visualized. Aorta: The ascending aorta was not well visualized. IAS/Shunts: No atrial level shunt detected by color flow Doppler. Additional Comments: 3D was performed not requiring image post processing on an independent workstation and was indeterminate.  LEFT VENTRICLE PLAX 2D LVIDd:         4.80 cm   Diastology LVIDs:         3.00 cm   LV e' medial:    8.05 cm/s LV PW:         1.10 cm   LV E/e' medial:  10.4 LV IVS:        0.80 cm   LV e' lateral:   13.40 cm/s LVOT diam:     2.20 cm   LV E/e' lateral: 6.2 LV SV:         66 LV SV Index:  29 LVOT Area:     3.80 cm  RIGHT VENTRICLE RV S prime:     15.20 cm/s TAPSE (M-mode): 1.9 cm LEFT ATRIUM             Index        RIGHT ATRIUM           Index LA diam:        4.30 cm 1.92 cm/m   RA Area:     13.20 cm LA Vol (A2C):   52.9 ml 23.61 ml/m  RA Volume:   29.10 ml  12.99 ml/m LA Vol (A4C):   50.4 ml 22.50 ml/m LA  Biplane Vol: 55.1 ml 24.60 ml/m  AORTIC VALVE AV Area (Vmax): 2.82 cm AV Vmax:        160.00 cm/s AV Peak Grad:   10.2 mmHg LVOT Vmax:      118.60 cm/s LVOT Vmean:     75.700 cm/s LVOT VTI:       0.173 m  AORTA Ao Root diam: 3.00 cm Ao Asc diam:  3.10 cm MITRAL VALVE MV Area (PHT): 5.48 cm    SHUNTS MV Decel Time: 139 msec    Systemic VTI:  0.17 m MV E velocity: 83.60 cm/s  Systemic Diam: 2.20 cm MV A velocity: 38.80 cm/s MV E/A ratio:  2.15 Dwayne D Callwood MD Electronically signed by Antonette Batters MD Signature Date/Time: 01/12/2024/10:14:28 PM    Final    US  Abdomen Limited RUQ (LIVER/GB) Result Date: 01/10/2024 CLINICAL DATA:  [id number] AKI (acute kidney injury) (HCC) [id number]; [id number] LFTs abnormal [id number] EXAM: ULTRASOUND ABDOMEN LIMITED RIGHT UPPER QUADRANT COMPARISON:  None Available. FINDINGS: Gallbladder: No gallstones, pericholecystic fluid or wall thickening visualized. Common bile duct: Diameter: 0.3 cm. Liver: Liver is hyperechoic consistent with fatty infiltration. No focal hepatic lesions identified. No intrahepatic ductal dilatation. Hepatopetal portal vein flow. IMPRESSION: Hepatic fatty infiltration. Otherwise unremarkable examination of the right upper quadrant. EXAM: RENAL / URINARY TRACT ULTRASOUND COMPLETE COMPARISON:  None Available. FINDINGS: The right kidney measured 12.5 cm and the left kidney measured 13.6 cm. The kidneys demonstrate normal echogenicity. No renal parenchymal lesions are identified. No shadowing stones are seen. No hydronephrosis. Bladder: Empty and not evaluable. IMPRESSION: Unremarkable renal ultrasound. Electronically Signed   By: Sydell Eva M.D.   On: 01/10/2024 10:29   US  RENAL Result Date: 01/10/2024 CLINICAL DATA:  [id number] AKI (acute kidney injury) (HCC) [id number]; [id number] LFTs abnormal [id number] EXAM: ULTRASOUND ABDOMEN LIMITED RIGHT UPPER QUADRANT COMPARISON:  None Available. FINDINGS: Gallbladder: No gallstones, pericholecystic fluid or wall thickening  visualized. Common bile duct: Diameter: 0.3 cm. Liver: Liver is hyperechoic consistent with fatty infiltration. No focal hepatic lesions identified. No intrahepatic ductal dilatation. Hepatopetal portal vein flow. IMPRESSION: Hepatic fatty infiltration. Otherwise unremarkable examination of the right upper quadrant. EXAM: RENAL / URINARY TRACT ULTRASOUND COMPLETE COMPARISON:  None Available. FINDINGS: The right kidney measured 12.5 cm and the left kidney measured 13.6 cm. The kidneys demonstrate normal echogenicity. No renal parenchymal lesions are identified. No shadowing stones are seen. No hydronephrosis. Bladder: Empty and not evaluable. IMPRESSION: Unremarkable renal ultrasound. Electronically Signed   By: Sydell Eva M.D.   On: 01/10/2024 10:29    ECHO as above.  TELEMETRY reviewed by me 01/13/2024: Paroxsymal AF rate 100-110s  EKG reviewed by me: Sinus tachycardia rate 104 bpm  Data reviewed by me 01/13/2024: last 24h vitals tele labs imaging I/O hospitalist progress note  Principal Problem:   AKI (acute kidney injury) (HCC) Active  Problems:   Diarrhea   LFT elevation    ASSESSMENT AND PLAN:  Steven Mcintosh is a 53 y.o. male  with a past medical history of hypertension, hyperlipidemia, IIDM, fatty liver, hepatitis C, obesity who presented to the ED on 01/10/2024 for persistent nausea, vomiting, diarrhea and generalized bodyaches.  On 06/15 around 1700 telemetry showed patient was in atrial fibrillation with RVR rates in the 140s.  Patient converted into sinus rhythm s/p 1 dose of IV metoprolol tartrate 5 mg and 1 dose of IV digoxin 0.125.  Patient reports no known history of paroxysmal atrial fibrillation.  Cardiology was consulted for further evaluation.   # Sepsis secondary to acute gastroenteritis by E Coli. # History of hepatitis C, fatty liver # Atrial fibrillation RVR # Paroxsymal atrial fibrillation # Hypertension # Hyperlipidemia Patient with no known history of atrial  fibrillation presents with sxs of gastroenteritis. Tele on 06/15 at around 1700 revealed atrial fibrillation RVR rate in 140s. Echo this admission with pEF, no RWMA, grade II diastolic dysfunction. Per tele patient in/out of AF rates 100-110s.  -Monitor and replenish electrolytes for a goal K >4, Mag >2  -Continue metoprolol tartrate to 75 mg twice daily. -Ordered IV amio bolus and infusion. -Consolidate PO Cardizem to 180 mg BID. May uptitarte as BP allows -CHA2DS2VASc score of 2 (in the setting of HTN, diabetes history). Would recommend starting anticoagulation for stroke risk reduction. Patient denies any recent falls, or prior major bleeding.  -Continue Eliquis 5 mg twice daily for stroke risk reduction.  -Continue aspirin 81 mg, atorvastatin  40 mg daily. -Recommend outpatient EP referral.    This patient's plan of care was discussed and created with Dr. Beau Bound and he is in agreement.  Signed: Creighton Doffing, PA-C  01/13/2024, 11:49 AM [facility] Cardiology

## 2024-01-13 NOTE — Plan of Care (Signed)
  Problem: Education: Goal: Ability to describe self-care measures that may prevent or decrease complications (Diabetes Survival Skills Education) will improve Outcome: Progressing   Problem: Nutritional: Goal: Maintenance of adequate nutrition will improve Outcome: Progressing Goal: Progress toward achieving an optimal weight will improve Outcome: Progressing   Problem: Education: Goal: Knowledge of General Education information will improve Description: Including pain rating scale, medication(s)/side effects and non-pharmacologic comfort measures Outcome: Progressing   Problem: Clinical Measurements: Goal: Cardiovascular complication will be avoided Outcome: Progressing   Problem: Elimination: Goal: Will not experience complications related to bowel motility Outcome: Progressing

## 2024-01-13 NOTE — Progress Notes (Signed)
 [facility] Progress Note  Patient: Steven Mcintosh    ZOX:[id number]  DOA: 01/10/2024     Date of Service: the patient was seen and examined on 01/13/2024  Chief Complaint  Patient presents with   Nausea    N/D   Brief hospital course: Steven Mcintosh is a 53 y.o. male with medical history significant of IIDM, HTN, HLD, fatty liver, hepatitis C s/p treatment, PAF, morbid obesity, presented with worsening symptoms of persistent diarrhea, nauseous vomiting whole body ache.  Patient was found to have traveler's diarrhea, GI pathogen positive for enterotoxigenic E. Coli Patient was started on azithromycin Further management as below.   Assessment and Plan:  # Sepsis secondary to acute gastroenteritis caused by E. Coli   GI pathogen positive for ETEC S/p azithromycin 500 mg p.o. daily x 3 days Blood culture NGTD   # Transaminitis, repeat due to sepsis US  RUQ fatty liver, no any other findings LFTs improving   # Paroxysmal A-fib with RVR, acute onset, resolved, converted back to sinus rhythm, S/p IV Lopressor and digoxin given on 6/15 Continue to monitor on telemetry Started aspirin 81 mg p.o. daily 6/16 increased Lopressor 75 mg p.o. twice daily Started Eliquis 5 mg p.o. twice daily S/p Cardizem IV infusion, started on 6/16, stopped on 6/17 6/17 Cardizem IR 60 mg p.o. every 6 hourly 6/18 started amiodarone IV infusion due to paroxysmal A-fib TTE LVEF 65 to 70%, grade 2 diastolic dysfunction. Follow cardiology for further recommendation  # AKI with oliguria most likely due to dehydration secondary to acute gastroenteritis Renal sonogram ruled out any significant findings S/p IV fluid, AKI resolved Creatinine 1.02 Mild metabolic acidosis, resolved  # Hypokalemia, potassium repleted.  Resolved # Hypomagnesemia, resolved  # HTN Blood pressure was low due to sepsis so held home medications Started Lopressor due to paroxysmal A-fib with RVR BP now elevated Continue  monitoring use hydralazine    # IIDM -Hold o all p.o. diabetic medications - SSI  # Vitamin D deficiency: started vitamin D 50,000 units p.o. weekly, follow with PCP to repeat vitamin D level after 3 to 6 months.   There is no height or weight on file to calculate BMI.  Interventions:  Diet: Carb modified diet DVT Prophylaxis: Subcutaneous Heparin    Advance goals of care discussion: Full code  Family Communication: family was present at bedside, at the time of interview.  The pt provided permission to discuss medical plan with the family. Opportunity was given to ask question and all questions were answered satisfactorily.   Disposition:  Pt is from home, admitted with traveler's diarrhea, still has slight imbalance, which precludes a safe discharge. Discharge to home, when stable, most likely tomorrow a.m.  Subjective: No significant events overnight, yesterday patient had less episodes of the diarrhea, today she drank milk and after that had some spaghetti, started having more loose stools a day.  Denied any abdominal pain.  Patient remained in A-fib with RVR so patient was started on amiodarone infusion, feels improvement in the heart rate, converted back to normal sinus rhythm. Patient denies any chest pain or palpitations.   Physical Exam: General: NAD, lying comfortably Appear in no distress, affect appropriate Eyes: PERRLA ENT: Oral Mucosa Clear, moist  Neck: no JVD,  Cardiovascular: S1 and S2 Present, no Murmur,  Respiratory: good respiratory effort, Bilateral Air entry equal and Decreased, no Crackles, no wheezes Abdomen: BS present, Soft, obese, mild lower abdominal tenderness Skin: no rashes Extremities: no Pedal edema, no  calf tenderness Neurologic: without any new focal findings Gait not checked due to patient safety concerns  Vitals:   01/13/24 0832 01/13/24 1242 01/13/24 1511 01/13/24 1701  BP: (!) 157/92 (!) 150/77    Pulse: 90 93    Resp:   18 20   Temp: 98.3 F (36.8 C) 99 F (37.2 C)    TempSrc:      SpO2: 94% 92%     No intake or output data in the 24 hours ending 01/13/24 1708  There were no vitals filed for this visit.  Data Reviewed: I have personally reviewed and interpreted daily labs, tele strips, imagings as discussed above. I reviewed all nursing notes, pharmacy notes, vitals, pertinent old records I have discussed plan of care as described above with RN and patient/family.  CBC: Recent Labs  Lab 01/10/24 0716 01/11/24 0259 01/12/24 0519 01/13/24 0544  WBC 7.2 4.8 7.1 9.4  NEUTROABS 5.0  --   --   --   HGB 18.8* 16.3 16.8 18.2*  HCT 55.5* 49.3 50.2 55.1*  MCV 76.7* 77.4* 77.1* 78.0*  PLT 159 157 217 232   Basic Metabolic Panel: Recent Labs  Lab 01/10/24 0716 01/11/24 0259 01/12/24 0519 01/13/24 0544  NA 131* 135 136 136  K 4.0 3.3* 4.0 4.1  CL 95* 104 103 98  CO2 21* 20* 25 26  GLUCOSE 187* 115* 143* 142*  BUN 27* 27* 19 19  CREATININE 2.67* 1.11 0.76 1.02  CALCIUM  8.5* 7.8* 8.4* 8.7*  MG 1.4* 2.2 2.4 1.9  PHOS 4.4  --  2.5 3.8    Studies: ECHOCARDIOGRAM COMPLETE Result Date: 01/12/2024    ECHOCARDIOGRAM REPORT   Patient Name:   Steven Mcintosh Date of Exam: 01/12/2024 Medical Rec #:  [id number]      Height:       66.5 in Accession #:    [id number]     Weight:       257.8 lb Date of Birth:  Dec 02, 1970      BSA:          2.240 m Patient Age:    53 years       BP:           114/72 mmHg Patient Gender: M              HR:           131 bpm. Exam Location:  [facility] Procedure: 2D Echo, Cardiac Doppler and Color Doppler (Both Spectral and Color            Flow Doppler were utilized during procedure). Indications:     Atrial Fibrillation I48.91  History:         Patient has no prior history of Echocardiogram examinations.                  Risk Factors:Hypertension, Diabetes and Dyslipidemia.  Sonographer:     Terrilee Few RCS Referring Phys:  [id number] Althia Atlas Diagnosing Phys: Dwayne D Callwood MD  IMPRESSIONS  1. Left ventricular ejection fraction, by estimation, is 65 to 70%. The left ventricle has normal function. The left ventricle has no regional wall motion abnormalities. Left ventricular diastolic parameters are consistent with Grade II diastolic dysfunction (pseudonormalization).  2. Right ventricular systolic function is normal. The right ventricular size is normal.  3. The mitral valve is normal in structure. No evidence of mitral valve regurgitation.  4. The aortic valve is normal in structure. Aortic valve regurgitation is not  visualized. FINDINGS  Left Ventricle: Left ventricular ejection fraction, by estimation, is 65 to 70%. The left ventricle has normal function. The left ventricle has no regional wall motion abnormalities. Global longitudinal strain performed but not reported based on interpreter judgement due to suboptimal tracking. The left ventricular internal cavity size was normal in size. There is no left ventricular hypertrophy. Left ventricular diastolic parameters are consistent with Grade II diastolic dysfunction (pseudonormalization). Right Ventricle: The right ventricular size is normal. No increase in right ventricular wall thickness. Right ventricular systolic function is normal. Left Atrium: Left atrial size was normal in size. Right Atrium: Right atrial size was normal in size. Pericardium: There is no evidence of pericardial effusion. Mitral Valve: The mitral valve is normal in structure. No evidence of mitral valve regurgitation. Tricuspid Valve: The tricuspid valve is normal in structure. Tricuspid valve regurgitation is trivial. Aortic Valve: The aortic valve is normal in structure. Aortic valve regurgitation is not visualized. Aortic valve peak gradient measures 10.2 mmHg. Pulmonic Valve: The pulmonic valve was normal in structure. Pulmonic valve regurgitation is not visualized. Aorta: The ascending aorta was not well visualized. IAS/Shunts: No atrial level shunt detected  by color flow Doppler. Additional Comments: 3D was performed not requiring image post processing on an independent workstation and was indeterminate.  LEFT VENTRICLE PLAX 2D LVIDd:         4.80 cm   Diastology LVIDs:         3.00 cm   LV e' medial:    8.05 cm/s LV PW:         1.10 cm   LV E/e' medial:  10.4 LV IVS:        0.80 cm   LV e' lateral:   13.40 cm/s LVOT diam:     2.20 cm   LV E/e' lateral: 6.2 LV SV:         66 LV SV Index:   29 LVOT Area:     3.80 cm  RIGHT VENTRICLE RV S prime:     15.20 cm/s TAPSE (M-mode): 1.9 cm LEFT ATRIUM             Index        RIGHT ATRIUM           Index LA diam:        4.30 cm 1.92 cm/m   RA Area:     13.20 cm LA Vol (A2C):   52.9 ml 23.61 ml/m  RA Volume:   29.10 ml  12.99 ml/m LA Vol (A4C):   50.4 ml 22.50 ml/m LA Biplane Vol: 55.1 ml 24.60 ml/m  AORTIC VALVE AV Area (Vmax): 2.82 cm AV Vmax:        160.00 cm/s AV Peak Grad:   10.2 mmHg LVOT Vmax:      118.60 cm/s LVOT Vmean:     75.700 cm/s LVOT VTI:       0.173 m  AORTA Ao Root diam: 3.00 cm Ao Asc diam:  3.10 cm MITRAL VALVE MV Area (PHT): 5.48 cm    SHUNTS MV Decel Time: 139 msec    Systemic VTI:  0.17 m MV E velocity: 83.60 cm/s  Systemic Diam: 2.20 cm MV A velocity: 38.80 cm/s MV E/A ratio:  2.15 Dwayne D Callwood MD Electronically signed by Antonette Batters MD Signature Date/Time: 01/12/2024/10:14:28 PM    Final     Scheduled Meds:  acidophilus  2 capsule Oral TID   apixaban  5 mg Oral BID  aspirin EC  81 mg Oral Daily   atorvastatin   40 mg Oral QPM   [START ON 01/14/2024] diltiazem  240 mg Oral Daily   insulin aspart  0-9 Units Subcutaneous TID WC   metoprolol tartrate  75 mg Oral BID   pantoprazole  40 mg Oral Daily   traZODone   50 mg Oral QHS   Vitamin D (Ergocalciferol)  50,000 Units Oral Q7 days   Continuous Infusions:  amiodarone 30 mg/hr (01/13/24 1703)   PRN Meds: acetaminophen  **OR** acetaminophen , calcium  carbonate, dicyclomine, hydrALAZINE **OR** hydrALAZINE, HYDROmorphone  (DILAUDID) injection, loperamide, melatonin, metoprolol tartrate, ondansetron **OR** ondansetron (ZOFRAN) IV, oxyCODONE  Time spent: 40 minutes  Author: Althia Atlas. MD [facility] 01/13/2024 5:08 PM  To reach On-call, see care teams to locate the attending and reach out to them via [email or web address]. If 7PM-7AM, please contact night-coverage If you still have difficulty reaching the attending provider, please page the [facility] (Director on Call) for [facility] on amion for assistance.

## 2024-01-13 NOTE — Plan of Care (Signed)
  Problem: Clinical Measurements: Goal: Ability to maintain clinical measurements within normal limits will improve Outcome: Progressing   Problem: Clinical Measurements: Goal: Cardiovascular complication will be avoided Outcome: Progressing   Problem: Pain Managment: Goal: General experience of comfort will improve and/or be controlled Outcome: Progressing

## 2024-01-14 ENCOUNTER — Other Ambulatory Visit: Payer: Self-pay

## 2024-01-14 DIAGNOSIS — N179 Acute kidney failure, unspecified: Secondary | ICD-10-CM | POA: Diagnosis not present

## 2024-01-14 LAB — CBC
HCT: 49.8 % (ref 39.0–52.0)
Hemoglobin: 16.5 g/dL (ref 13.0–17.0)
MCH: 25.9 pg — ABNORMAL LOW (ref 26.0–34.0)
MCHC: 33.1 g/dL (ref 30.0–36.0)
MCV: 78.1 fL — ABNORMAL LOW (ref 80.0–100.0)
Platelets: 234 10*3/uL (ref 150–400)
RBC: 6.38 MIL/uL — ABNORMAL HIGH (ref 4.22–5.81)
RDW: 14.8 % (ref 11.5–15.5)
WBC: 9.7 10*3/uL (ref 4.0–10.5)
nRBC: 0 % (ref 0.0–0.2)

## 2024-01-14 LAB — HEPATIC FUNCTION PANEL
ALT: 212 U/L — ABNORMAL HIGH (ref 0–44)
AST: 95 U/L — ABNORMAL HIGH (ref 15–41)
Albumin: 3 g/dL — ABNORMAL LOW (ref 3.5–5.0)
Alkaline Phosphatase: 37 U/L — ABNORMAL LOW (ref 38–126)
Bilirubin, Direct: 0.3 mg/dL — ABNORMAL HIGH (ref 0.0–0.2)
Indirect Bilirubin: 0.9 mg/dL (ref 0.3–0.9)
Total Bilirubin: 1.2 mg/dL (ref 0.0–1.2)
Total Protein: 6.8 g/dL (ref 6.5–8.1)

## 2024-01-14 LAB — BASIC METABOLIC PANEL WITH GFR
Anion gap: 11 (ref 5–15)
BUN: 16 mg/dL (ref 6–20)
CO2: 22 mmol/L (ref 22–32)
Calcium: 8.4 mg/dL — ABNORMAL LOW (ref 8.9–10.3)
Chloride: 102 mmol/L (ref 98–111)
Creatinine, Ser: 0.8 mg/dL (ref 0.61–1.24)
GFR, Estimated: >60 mL/min (ref >60)
Glucose, Bld: 126 mg/dL — ABNORMAL HIGH (ref 70–99)
Potassium: 3.6 mmol/L (ref 3.5–5.1)
Sodium: 135 mmol/L (ref 135–145)

## 2024-01-14 LAB — MAGNESIUM: Magnesium: 1.7 mg/dL (ref 1.7–2.4)

## 2024-01-14 LAB — GLUCOSE, CAPILLARY
Glucose-Capillary: 133 mg/dL — ABNORMAL HIGH (ref 70–99)
Glucose-Capillary: 136 mg/dL — ABNORMAL HIGH (ref 70–99)

## 2024-01-14 LAB — PHOSPHORUS: Phosphorus: 3.2 mg/dL (ref 2.5–4.6)

## 2024-01-14 MED ORDER — AMIODARONE HCL 200 MG PO TABS
200.0000 mg | ORAL_TABLET | Freq: Two times a day (BID) | ORAL | Status: DC
  Filled 2024-01-14: qty 1

## 2024-01-14 MED ORDER — PANTOPRAZOLE SODIUM 40 MG PO TBEC
40.0000 mg | DELAYED_RELEASE_TABLET | Freq: Every day | ORAL | 0 refills | Status: DC
  Filled 2024-01-14: qty 30, 30d supply, fill #0

## 2024-01-14 MED ORDER — AMIODARONE HCL 200 MG PO TABS
ORAL_TABLET | ORAL | 0 refills | Status: DC
  Filled 2024-01-14: qty 100, 90d supply, fill #0

## 2024-01-14 MED ORDER — DILTIAZEM HCL ER COATED BEADS 240 MG PO CP24
240.0000 mg | ORAL_CAPSULE | Freq: Every day | ORAL | 11 refills | Status: DC
  Filled 2024-01-14: qty 30, 30d supply, fill #0

## 2024-01-14 MED ORDER — APIXABAN 5 MG PO TABS
5.0000 mg | ORAL_TABLET | Freq: Two times a day (BID) | ORAL | 11 refills | Status: AC
  Filled 2024-01-14: qty 60, 30d supply, fill #0

## 2024-01-14 MED ORDER — POTASSIUM CHLORIDE CRYS ER 20 MEQ PO TBCR
40.0000 meq | EXTENDED_RELEASE_TABLET | Freq: Once | ORAL | Status: AC
  Filled 2024-01-14: qty 2

## 2024-01-14 MED ORDER — METOPROLOL TARTRATE 50 MG PO TABS
75.0000 mg | ORAL_TABLET | Freq: Two times a day (BID) | ORAL | 11 refills | Status: DC
  Filled 2024-01-14: qty 90, 30d supply, fill #0

## 2024-01-14 MED ORDER — AMIODARONE HCL 200 MG PO TABS: 200.0000 mg | ORAL_TABLET | Freq: Every day | ORAL | Status: DC

## 2024-01-14 MED ORDER — VITAMIN D (ERGOCALCIFEROL) 1.25 MG (50000 UNIT) PO CAPS
50000.0000 [IU] | ORAL_CAPSULE | ORAL | 0 refills | Status: AC
  Filled 2024-01-14: qty 12, 84d supply, fill #0

## 2024-01-14 NOTE — Discharge Summary (Signed)
 [facility] Discharge Summary   Patient: Steven Mcintosh ZOX:[id number]  PCP: Shari Daughters, MD  Date of admission: 01/10/2024   Date of discharge:  01/14/2024     Discharge Diagnoses:  Principal Problem:   AKI (acute kidney injury) (HCC) Active Problems:   Diarrhea   LFT elevation   Admitted From: Home Disposition:  Home   Recommendations for Outpatient Follow-up:  Follow-up with PCP in 1 week,   Follow-up with cardiology in 1 week Follow up LABS/TEST:  repeat LFTs in 1 week.  CBC and BMP after 1 week. continue to monitor thyroid function test and LFTs since patient is on amiodarone.  CT chest for lung cancer screening as an outpatient.    Follow-up Information     Custovic, Lanell Pinta, DO. Go in 1 week(s).   Specialty: Cardiology Contact information: [address] [place] [address] [phone]         Shari Daughters, MD Follow up in 1 week(s).   Specialty: Internal Medicine Contact information: [address] [place] [address] [phone]                Diet recommendation: Cardiac and Carb modified diet  Activity: The patient is advised to gradually reintroduce usual activities, as tolerated  Discharge Condition: stable  Code Status: Full code   History of present illness: As per the H and P dictated on admission. Hospital Course:  Steven Mcintosh is a 53 y.o. male with medical history significant of IIDM, HTN, HLD, fatty liver, hepatitis C s/p treatment, PAF, morbid obesity, presented with worsening symptoms of persistent diarrhea, nauseous vomiting whole body ache.  Patient was found to have traveler's diarrhea, GI pathogen positive for enterotoxigenic E. Coli Patient was started on azithromycin Further management as below.   Assessment and Plan:   # Sepsis secondary to acute gastroenteritis caused by E. Coli   GI pathogen positive for ETEC. S/p azithromycin 500 mg p.o. daily x 3 days. Blood culture NGTD.  Diarrhea  resolved, no abdominal pain, patient is tolerating diet well.     # Transaminitis, repeat due to sepsis US  RUQ fatty liver, no any other findings LFTs improving.  Repeat LFTs after 1 week and follow with PCP.   # Paroxysmal A-fib with RVR, acute onset, resolved, converted back to sinus rhythm, S/p IV Lopressor and digoxin given on 6/15 S/p Aspirin 81 mg p.o. daily given during hospital stay but he did not continue because patient is on Eliquis now. 6/16 increased Lopressor 75 mg p.o. twice daily Started Eliquis 5 mg p.o. twice daily S/p Cardizem IV infusion, started on 6/16, stopped on 6/17 6/17 started Cardizem IR 60 mg p.o. every 6 hourly 6/18 started amiodarone IV infusion due to paroxysmal A-fib TTE LVEF 65 to 70%, grade 2 diastolic dysfunction. Cardizem was consolidated Cardizem CD to 40 mg p.o. daily and patient was transition to oral amiodarone 200 mg p.o. twice daily for 10 days followed by amiodarone 200 mg p.o. daily.  Follow-up with cardiology in 1 week as an outpatient.  Monitor LFTs and TFTs  # AKI with oliguria most likely due to dehydration secondary to acute gastroenteritis. Renal sonogram ruled out any significant findings S/p IV fluid, AKI resolved. sCr 0.08. Mild metabolic acidosis, resolved # Hypokalemia, potassium repleted.  Resolved # Hypomagnesemia, resolved # HTN: Initially blood pressure was low due to sepsis so held home medications. Started Lopressor due to paroxysmal A-fib with RVR.  Patient was also started on Cardizem CD.  Advised to monitor BP and heart rate at home and follow with PCP and cardiology as an outpatient. # IIDM: Resumed home medications on discharge, advised to continue diabetic diet and follow with PCP. # Vitamin D deficiency: started vitamin D 50,000 units p.o. weekly, follow with PCP to repeat vitamin D level after 3 to 6 months.  There is no height or weight on file to calculate BMI.  Nutrition Interventions:  - Patient was instructed, not  to drive, operate heavy machinery, perform activities at heights, swimming or participation in water activities or provide baby sitting services while on Pain, Sleep and Anxiety Medications; until his outpatient Physician has advised to do so again.  - Also recommended to not to take more than prescribed Pain, Sleep and Anxiety Medications.  Patient was ambulatory without any assistance. On the day of the discharge the patient's vitals were stable, and no other acute medical condition were reported by patient. the patient was felt safe to be discharge at Home.  Consultants: Cardiology Procedures: None  Discharge Exam: General: Appear in no distress, no Rash; Oral Mucosa Clear, moist. Cardiovascular: S1 and S2 Present, no Murmur, Respiratory: normal respiratory effort, Bilateral Air entry present and no Crackles, no wheezes Abdomen: Bowel Sound present, Soft and no tenderness, no hernia Extremities: no Pedal edema, no calf tenderness Neurology: alert and oriented to time, place, and person affect appropriate.  There were no vitals filed for this visit. Vitals:   01/14/24 0847 01/14/24 1238  BP: 136/79 (!) 143/80  Pulse: 64 69  Resp:    Temp: 97.6 F (36.4 C) 98.7 F (37.1 C)  SpO2: 94% 95%    DISCHARGE MEDICATION: Allergies as of 01/14/2024   No Known Allergies      Medication List     STOP taking these medications    amLODipine  5 MG tablet Commonly known as: NORVASC    carvedilol  25 MG tablet Commonly known as: COREG    ibuprofen  800 MG tablet Commonly known as: ADVIL    pioglitazone  15 MG tablet Commonly known as: ACTOS    valsartan  320 MG tablet Commonly known as: DIOVAN        TAKE these medications    amiodarone 200 MG tablet Commonly known as: PACERONE Take 1 tablet (200 mg total) by mouth 2 (two) times daily for 10 days, THEN 1 tablet (200 mg total) daily. Start taking on: January 14, 2024   apixaban 5 MG Tabs tablet Commonly known as: ELIQUIS Take 1  tablet (5 mg total) by mouth 2 (two) times daily.   atorvastatin  40 MG tablet Commonly known as: LIPITOR TAKE 1 TABLET BY MOUTH ONCE DAILY IN THE EVENING   cyclobenzaprine  10 MG tablet Commonly known as: FLEXERIL  TAKE 1 TABLET BY MOUTH THREE TIMES DAILY AS NEEDED FOR  NECK  PAIN   diltiazem 240 MG 24 hr capsule Commonly known as: CARDIZEM CD Take 1 capsule (240 mg total) by mouth daily. Start taking on: January 15, 2024   Eroxon Gel Apply 1 Package topically as directed for 4 doses. Apply one tube to glans penis prior to intercourse   Invokana  300 MG Tabs tablet Generic drug: canagliflozin  Take 1 tablet (300 mg total) by mouth every morning.   metFORMIN  500 MG 24 hr tablet Commonly known as: GLUCOPHAGE -XR Take 1 tablet (500 mg total) by mouth daily with breakfast.   metoprolol tartrate 50 MG tablet Commonly known as: LOPRESSOR Take 1.5 tablets (75 mg total) by mouth 2 (two) times daily.   Ozempic  (2 MG/DOSE) 8  MG/3ML Sopn Generic drug: Semaglutide  (2 MG/DOSE) INJECT 2MG   SUBCUTANEOUSLY ONCE A WEEK   pantoprazole 40 MG tablet Commonly known as: PROTONIX Take 1 tablet (40 mg total) by mouth daily. Start taking on: January 15, 2024   sildenafil  100 MG tablet Commonly known as: VIAGRA  Take 1 tablet (100 mg total) by mouth as needed for erectile dysfunction.   traZODone  50 MG tablet Commonly known as: DESYREL  Take 1 tablet (50 mg total) by mouth at bedtime.   Vitamin D (Ergocalciferol) 1.25 MG (50000 UNIT) Caps capsule Commonly known as: DRISDOL Take 1 capsule (50,000 Units total) by mouth every 7 (seven) days. Start taking on: January 19, 2024       No Known Allergies Discharge Instructions     Call MD for:   Complete by: As directed    Chest pain or palpitations.  Any bruises or bleeding   Call MD for:  difficulty breathing, headache or visual disturbances   Complete by: As directed    Call MD for:  extreme fatigue   Complete by: As directed    Call MD for:   persistant dizziness or light-headedness   Complete by: As directed    Call MD for:  persistant nausea and vomiting   Complete by: As directed    Call MD for:  severe uncontrolled pain   Complete by: As directed    Call MD for:  temperature >100.4   Complete by: As directed    Diet - low sodium heart healthy   Complete by: As directed    Discharge instructions   Complete by: As directed    Follow-up with PCP in 1 week, repeat LFTs in 1 week.  CBC and BMP after 1 week. continue to monitor thyroid function test and LFTs since patient is on amiodarone.  CT chest for lung cancer screening as an outpatient. Follow-up with cardiology in 1 week   Increase activity slowly   Complete by: As directed        The results of significant diagnostics from this hospitalization (including imaging, microbiology, ancillary and laboratory) are listed below for reference.    Significant Diagnostic Studies: ECHOCARDIOGRAM COMPLETE Result Date: 01/12/2024    ECHOCARDIOGRAM REPORT   Patient Name:   Adella Honey Date of Exam: 01/12/2024 Medical Rec #:  [id number]      Height:       66.5 in Accession #:    [id number]     Weight:       257.8 lb Date of Birth:  1970/12/28      BSA:          2.240 m Patient Age:    53 years       BP:           114/72 mmHg Patient Gender: M              HR:           131 bpm. Exam Location:  [facility] Procedure: 2D Echo, Cardiac Doppler and Color Doppler (Both Spectral and Color            Flow Doppler were utilized during procedure). Indications:     Atrial Fibrillation I48.91  History:         Patient has no prior history of Echocardiogram examinations.                  Risk Factors:Hypertension, Diabetes and Dyslipidemia.  Sonographer:     Terrilee Few RCS Referring Phys:  [id number] Shaine Newmark YNWGN Diagnosing Phys: Dwayne D Callwood MD IMPRESSIONS  1. Left ventricular ejection fraction, by estimation, is 65 to 70%. The left ventricle has normal function. The left ventricle has no regional  wall motion abnormalities. Left ventricular diastolic parameters are consistent with Grade II diastolic dysfunction (pseudonormalization).  2. Right ventricular systolic function is normal. The right ventricular size is normal.  3. The mitral valve is normal in structure. No evidence of mitral valve regurgitation.  4. The aortic valve is normal in structure. Aortic valve regurgitation is not visualized. FINDINGS  Left Ventricle: Left ventricular ejection fraction, by estimation, is 65 to 70%. The left ventricle has normal function. The left ventricle has no regional wall motion abnormalities. Global longitudinal strain performed but not reported based on interpreter judgement due to suboptimal tracking. The left ventricular internal cavity size was normal in size. There is no left ventricular hypertrophy. Left ventricular diastolic parameters are consistent with Grade II diastolic dysfunction (pseudonormalization). Right Ventricle: The right ventricular size is normal. No increase in right ventricular wall thickness. Right ventricular systolic function is normal. Left Atrium: Left atrial size was normal in size. Right Atrium: Right atrial size was normal in size. Pericardium: There is no evidence of pericardial effusion. Mitral Valve: The mitral valve is normal in structure. No evidence of mitral valve regurgitation. Tricuspid Valve: The tricuspid valve is normal in structure. Tricuspid valve regurgitation is trivial. Aortic Valve: The aortic valve is normal in structure. Aortic valve regurgitation is not visualized. Aortic valve peak gradient measures 10.2 mmHg. Pulmonic Valve: The pulmonic valve was normal in structure. Pulmonic valve regurgitation is not visualized. Aorta: The ascending aorta was not well visualized. IAS/Shunts: No atrial level shunt detected by color flow Doppler. Additional Comments: 3D was performed not requiring image post processing on an independent workstation and was indeterminate.  LEFT  VENTRICLE PLAX 2D LVIDd:         4.80 cm   Diastology LVIDs:         3.00 cm   LV e' medial:    8.05 cm/s LV PW:         1.10 cm   LV E/e' medial:  10.4 LV IVS:        0.80 cm   LV e' lateral:   13.40 cm/s LVOT diam:     2.20 cm   LV E/e' lateral: 6.2 LV SV:         66 LV SV Index:   29 LVOT Area:     3.80 cm  RIGHT VENTRICLE RV S prime:     15.20 cm/s TAPSE (M-mode): 1.9 cm LEFT ATRIUM             Index        RIGHT ATRIUM           Index LA diam:        4.30 cm 1.92 cm/m   RA Area:     13.20 cm LA Vol (A2C):   52.9 ml 23.61 ml/m  RA Volume:   29.10 ml  12.99 ml/m LA Vol (A4C):   50.4 ml 22.50 ml/m LA Biplane Vol: 55.1 ml 24.60 ml/m  AORTIC VALVE AV Area (Vmax): 2.82 cm AV Vmax:        160.00 cm/s AV Peak Grad:   10.2 mmHg LVOT Vmax:      118.60 cm/s LVOT Vmean:     75.700 cm/s LVOT VTI:       0.173 m  AORTA Ao Root diam:  3.00 cm Ao Asc diam:  3.10 cm MITRAL VALVE MV Area (PHT): 5.48 cm    SHUNTS MV Decel Time: 139 msec    Systemic VTI:  0.17 m MV E velocity: 83.60 cm/s  Systemic Diam: 2.20 cm MV A velocity: 38.80 cm/s MV E/A ratio:  2.15 Dwayne Charlett Conroy MD Electronically signed by Antonette Batters MD Signature Date/Time: 01/12/2024/10:14:28 PM    Final    US  Abdomen Limited RUQ (LIVER/GB) Result Date: 01/10/2024 CLINICAL DATA:  [id number] AKI (acute kidney injury) (HCC) [id number]; [id number] LFTs abnormal [id number] EXAM: ULTRASOUND ABDOMEN LIMITED RIGHT UPPER QUADRANT COMPARISON:  None Available. FINDINGS: Gallbladder: No gallstones, pericholecystic fluid or wall thickening visualized. Common bile duct: Diameter: 0.3 cm. Liver: Liver is hyperechoic consistent with fatty infiltration. No focal hepatic lesions identified. No intrahepatic ductal dilatation. Hepatopetal portal vein flow. IMPRESSION: Hepatic fatty infiltration. Otherwise unremarkable examination of the right upper quadrant. EXAM: RENAL / URINARY TRACT ULTRASOUND COMPLETE COMPARISON:  None Available. FINDINGS: The right kidney measured 12.5 cm and the  left kidney measured 13.6 cm. The kidneys demonstrate normal echogenicity. No renal parenchymal lesions are identified. No shadowing stones are seen. No hydronephrosis. Bladder: Empty and not evaluable. IMPRESSION: Unremarkable renal ultrasound. Electronically Signed   By: Sydell Eva M.D.   On: 01/10/2024 10:29   US  RENAL Result Date: 01/10/2024 CLINICAL DATA:  [id number] AKI (acute kidney injury) (HCC) [id number]; [id number] LFTs abnormal [id number] EXAM: ULTRASOUND ABDOMEN LIMITED RIGHT UPPER QUADRANT COMPARISON:  None Available. FINDINGS: Gallbladder: No gallstones, pericholecystic fluid or wall thickening visualized. Common bile duct: Diameter: 0.3 cm. Liver: Liver is hyperechoic consistent with fatty infiltration. No focal hepatic lesions identified. No intrahepatic ductal dilatation. Hepatopetal portal vein flow. IMPRESSION: Hepatic fatty infiltration. Otherwise unremarkable examination of the right upper quadrant. EXAM: RENAL / URINARY TRACT ULTRASOUND COMPLETE COMPARISON:  None Available. FINDINGS: The right kidney measured 12.5 cm and the left kidney measured 13.6 cm. The kidneys demonstrate normal echogenicity. No renal parenchymal lesions are identified. No shadowing stones are seen. No hydronephrosis. Bladder: Empty and not evaluable. IMPRESSION: Unremarkable renal ultrasound. Electronically Signed   By: Sydell Eva M.D.   On: 01/10/2024 10:29    Microbiology: Recent Results (from the past 240 hours)  Blood Culture (routine x 2)     Status: None (Preliminary result)   Collection Time: 01/10/24  7:16 AM   Specimen: BLOOD  Result Value Ref Range Status   Specimen Description BLOOD BLOOD LEFT ARM  Final   Special Requests   Final    BOTTLES DRAWN AEROBIC AND ANAEROBIC Blood Culture results may not be optimal due to an inadequate volume of blood received in culture bottles   Culture   Final    NO GROWTH 3 DAYS Performed at [facility], [address] [place], [place] [address]     Report Status PENDING  Incomplete  Blood Culture (routine x 2)     Status: None (Preliminary result)   Collection Time: 01/10/24  7:16 AM   Specimen: BLOOD  Result Value Ref Range Status   Specimen Description BLOOD BLOOD RIGHT ARM  Final   Special Requests   Final    BOTTLES DRAWN AEROBIC AND ANAEROBIC Blood Culture results may not be optimal due to an inadequate volume of blood received in culture bottles   Culture   Final    NO GROWTH 3 DAYS Performed at [facility], [address], [place], [place] [address]    Report Status PENDING  Incomplete  Resp  panel by RT-PCR (RSV, Flu A&B, Covid) Anterior Nasal Swab     Status: None   Collection Time: 01/10/24  7:16 AM   Specimen: Anterior Nasal Swab  Result Value Ref Range Status   SARS Coronavirus 2 by RT PCR NEGATIVE NEGATIVE Final    Comment: (NOTE) SARS-CoV-2 target nucleic acids are NOT DETECTED.  The SARS-CoV-2 RNA is generally detectable in upper respiratory specimens during the acute phase of infection. The lowest concentration of SARS-CoV-2 viral copies this assay can detect is 138 copies/mL. A negative result does not preclude SARS-Cov-2 infection and should not be used as the sole basis for treatment or other patient management decisions. A negative result may occur with  improper specimen collection/handling, submission of specimen other than nasopharyngeal swab, presence of viral mutation(s) within the areas targeted by this assay, and inadequate number of viral copies(<138 copies/mL). A negative result must be combined with clinical observations, patient history, and epidemiological information. The expected result is Negative.  Fact Sheet for Patients:  BloggerCourse.com  Fact Sheet for Healthcare Providers:  SeriousBroker.it  This test is no t yet approved or cleared by the [place]  FDA and  has been authorized for detection and/or diagnosis of  SARS-CoV-2 by FDA under an Emergency Use Authorization (EUA). This EUA will remain  in effect (meaning this test can be used) for the duration of the COVID-19 declaration under Section 564(b)(1) of the Act, 21 U.S.C.section 360bbb-3(b)(1), unless the authorization is terminated  or revoked sooner.       Influenza A by PCR NEGATIVE NEGATIVE Final   Influenza B by PCR NEGATIVE NEGATIVE Final    Comment: (NOTE) The Xpert Xpress SARS-CoV-2/FLU/RSV plus assay is intended as an aid in the diagnosis of influenza from Nasopharyngeal swab specimens and should not be used as a sole basis for treatment. Nasal washings and aspirates are unacceptable for Xpert Xpress SARS-CoV-2/FLU/RSV testing.  Fact Sheet for Patients: BloggerCourse.com  Fact Sheet for Healthcare Providers: SeriousBroker.it  This test is not yet approved or cleared by the [place]  FDA and has been authorized for detection and/or diagnosis of SARS-CoV-2 by FDA under an Emergency Use Authorization (EUA). This EUA will remain in effect (meaning this test can be used) for the duration of the COVID-19 declaration under Section 564(b)(1) of the Act, 21 U.S.C. section 360bbb-3(b)(1), unless the authorization is terminated or revoked.     Resp Syncytial Virus by PCR NEGATIVE NEGATIVE Final    Comment: (NOTE) Fact Sheet for Patients: BloggerCourse.com  Fact Sheet for Healthcare Providers: SeriousBroker.it  This test is not yet approved or cleared by the [place]  FDA and has been authorized for detection and/or diagnosis of SARS-CoV-2 by FDA under an Emergency Use Authorization (EUA). This EUA will remain in effect (meaning this test can be used) for the duration of the COVID-19 declaration under Section 564(b)(1) of the Act, 21 U.S.C. section 360bbb-3(b)(1), unless the authorization is terminated  or revoked.  Performed at [facility], [address] [place], [place] [address]   Gastrointestinal Panel by PCR , Stool     Status: Abnormal   Collection Time: 01/10/24  8:45 AM   Specimen: Stool  Result Value Ref Range Status   Campylobacter species NOT DETECTED NOT DETECTED Final   Plesimonas shigelloides NOT DETECTED NOT DETECTED Final   Salmonella species NOT DETECTED NOT DETECTED Final   Yersinia enterocolitica NOT DETECTED NOT DETECTED Final   Vibrio species NOT DETECTED NOT DETECTED Final   Vibrio cholerae NOT DETECTED  NOT DETECTED Final   Enteroaggregative E coli (EAEC) NOT DETECTED NOT DETECTED Final   Enteropathogenic E coli (EPEC) DETECTED (A) NOT DETECTED Final    Comment: RESULT CALLED TO, READ BACK BY AND VERIFIED WITH: JES SAVAGE AT 1049 01/10/24.PMF    Enterotoxigenic E coli (ETEC) DETECTED (A) NOT DETECTED Final    Comment: RESULT CALLED TO, READ BACK BY AND VERIFIED WITH: JES SAVAGE AT 1049 01/10/24.PMF    Shiga like toxin producing E coli (STEC) NOT DETECTED NOT DETECTED Final   Shigella/Enteroinvasive E coli (EIEC) NOT DETECTED NOT DETECTED Final   Cryptosporidium NOT DETECTED NOT DETECTED Final   Cyclospora cayetanensis NOT DETECTED NOT DETECTED Final   Entamoeba histolytica NOT DETECTED NOT DETECTED Final   Giardia lamblia NOT DETECTED NOT DETECTED Final   Adenovirus F40/41 NOT DETECTED NOT DETECTED Final   Astrovirus NOT DETECTED NOT DETECTED Final   Norovirus GI/GII NOT DETECTED NOT DETECTED Final   Rotavirus A NOT DETECTED NOT DETECTED Final   Sapovirus (I, II, IV, and V) NOT DETECTED NOT DETECTED Final    Comment: Performed at [facility], [address] [place], [place] [address]  C Difficile Quick Screen w PCR reflex     Status: None   Collection Time: 01/10/24  8:45 AM   Specimen: Stool  Result Value Ref Range Status   C Diff antigen NEGATIVE NEGATIVE Final   C Diff toxin NEGATIVE NEGATIVE Final   C Diff interpretation No  C. difficile detected.  Final    Comment: Performed at [facility], [address] [place], [place] [address]  OVA + PARASITE EXAM     Status: None   Collection Time: 01/10/24  8:45 AM   Specimen: Stool  Result Value Ref Range Status   OVA + PARASITE EXAM Final report  Final    Comment: (NOTE) These results were obtained using wet preparation(s) and trichrome stained smear. This test does not include testing for Cryptosporidium parvum, Cyclospora, or Microsporidia. Performed At: [facility] [address] [place], [place] [id number] Pearlean Botts MD NG:[id number]    Source of Sample STOMATOCYTES  Final    Comment: Performed at [facility], [address] [place], [place] [address]     Labs: CBC: Recent Labs  Lab 01/10/24 [phone] 01/11/24 0259 01/12/24 0519 01/13/24 0544 01/14/24 0304  WBC 7.2 4.8 7.1 9.4 9.7  NEUTROABS 5.0  --   --   --   --   HGB 18.8* 16.3 16.8 18.2* 16.5  HCT 55.5* 49.3 50.2 55.1* 49.8  MCV 76.7* 77.4* 77.1* 78.0* 78.1*  PLT 159 157 217 232 234   Basic Metabolic Panel: Recent Labs  Lab 01/10/24 0716 01/11/24 0259 01/12/24 0519 01/13/24 0544 01/14/24 0304  NA 131* 135 136 136 135  K 4.0 3.3* 4.0 4.1 3.6  CL 95* 104 103 98 102  CO2 21* 20* 25 26 22   GLUCOSE 187* 115* 143* 142* 126*  BUN 27* 27* 19 19 16   CREATININE 2.67* 1.11 0.76 1.02 0.80  CALCIUM  8.5* 7.8* 8.4* 8.7* 8.4*  MG 1.4* 2.2 2.4 1.9 1.7  PHOS 4.4  --  2.5 3.8 3.2   Liver Function Tests: Recent Labs  Lab 01/10/24 0716 01/11/24 0259 01/12/24 0519 01/13/24 0544 01/14/24 0304  AST 179* 156* 33 32 95*  ALT 245* 432* 235* 172* 212*  ALKPHOS 40 34* 36* 39 37*  BILITOT 4.0* 2.6* 1.6* 1.7* 1.2  PROT 7.5 6.7 6.7 7.0 6.8  ALBUMIN 3.8 3.2*  3.2* 3.4* 3.0*   Recent Labs  Lab 01/10/24 0716  LIPASE 26   No results for input(s): AMMONIA in the last 168 hours. Cardiac Enzymes: Recent Labs  Lab 01/10/24 0716  CKTOTAL 102   BNP (last 3  results) No results for input(s): BNP in the last 8760 hours. CBG: Recent Labs  Lab 01/13/24 1243 01/13/24 1707 01/13/24 2110 01/14/24 0821 01/14/24 1240  GLUCAP 159* 147* 148* 133* 136*    Time spent: 35 minutes  Signed:  Althia Atlas  [facility] 01/14/2024 12:53 PM

## 2024-01-14 NOTE — Progress Notes (Signed)
 [facility] CLINIC CARDIOLOGY PROGRESS NOTE       Patient ID: Steven Mcintosh MRN: [id number] DOB/AGE: 01-Oct-1970 53 y.o.  Admit date: 01/10/2024 Referring Physician Dr. Hubert Madden Primary Physician Shari Daughters, MD Primary Cardiologist None Reason for Consultation paroxysmal atrial fibrillation  HPI: Steven Mcintosh is a 53 y.o. male  with a past medical history of hypertension, hyperlipidemia, IIDM, fatty liver, hepatitis C, obesity who presented to the ED on 01/10/2024 for persistent nausea, vomiting, diarrhea and generalized bodyaches.  On 06/15 around 1700 telemetry showed patient was in atrial fibrillation with RVR rates in the 140s.  Patient converted into sinus rhythm s/p 1 dose of IV metoprolol tartrate 5 mg and 1 dose of IV digoxin 0.125.  Patient reports no known history of paroxysmal atrial fibrillation.  Cardiology was consulted for further evaluation.   Interval History: -Patient seen and examined this AM and laying comfortably in hospital bed. Patient states he feels good this AM.Patient states palpitations are improving. Continues to deny any chest pain, SOB or lightheadedness.  -Patients BP stable and HR improving this AM. Per tele is in/out of AF rates 60-70s -Patient remains on room air with stable SpO2.  -LFTs elevated this AM. Will continue to monitor.  Review of systems complete and found to be negative unless listed above    Past Medical History:  Diagnosis Date   Diabetes mellitus without complication (HCC)    Hypercholesteremia unk   Hypertension     Past Surgical History:  Procedure Laterality Date   DIAPHRAGM SURGERY     ENUCLEATION Left     Medications Prior to Admission  Medication Sig Dispense Refill Last Dose/Taking   amLODipine  (NORVASC ) 5 MG tablet Take 1 tablet (5 mg total) by mouth every morning. 30 tablet 0 Past Week   atorvastatin  (LIPITOR) 40 MG tablet TAKE 1 TABLET BY MOUTH ONCE DAILY IN THE EVENING 90 tablet 0 Past Week   carvedilol  (COREG ) 25  MG tablet Take 2 tablets by mouth twice daily 120 tablet 3 01/09/2024 Bedtime   cyclobenzaprine  (FLEXERIL ) 10 MG tablet TAKE 1 TABLET BY MOUTH THREE TIMES DAILY AS NEEDED FOR  NECK  PAIN 90 tablet 0 Taking   ibuprofen  (ADVIL ) 800 MG tablet Take 1 tablet (800 mg total) by mouth 3 (three) times daily as needed. 90 tablet 0 Taking As Needed   INVOKANA  300 MG TABS tablet Take 1 tablet (300 mg total) by mouth every morning. 90 tablet 0 Past Week   metFORMIN  (GLUCOPHAGE -XR) 500 MG 24 hr tablet Take 1 tablet (500 mg total) by mouth daily with breakfast. 360 tablet 0 Past Week   OZEMPIC , 2 MG/DOSE, 8 MG/3ML SOPN INJECT 2MG   SUBCUTANEOUSLY ONCE A WEEK 3 mL 2 Past Month   sildenafil  (VIAGRA ) 100 MG tablet Take 1 tablet (100 mg total) by mouth as needed for erectile dysfunction. 15 tablet 2 Taking As Needed   traZODone  (DESYREL ) 50 MG tablet Take 1 tablet (50 mg total) by mouth at bedtime. 30 tablet 1 01/09/2024   valsartan  (DIOVAN ) 320 MG tablet Take 1 tablet (320 mg total) by mouth every morning. 30 tablet 0 Past Week   Intimacy Products (EROXON) GEL Apply 1 Package topically as directed for 4 doses. Apply one tube to glans penis prior to intercourse 1 each 1    pioglitazone  (ACTOS ) 15 MG tablet Take 1 tablet (15 mg total) by mouth daily. (Patient not taking: Reported on 01/10/2024) 90 tablet 0 Not Taking   Social History   Socioeconomic  History   Marital status: Single    Spouse name: Not on file   Number of children: Not on file   Years of education: Not on file   Highest education level: Not on file  Occupational History   Not on file  Tobacco Use   Smoking status: Every Day   Smokeless tobacco: Not on file  Substance and Sexual Activity   Alcohol use: Yes   Drug use: Not Currently   Sexual activity: Yes  Other Topics Concern   Not on file  Social History Narrative   Not on file   Social Drivers of Health   Financial Resource Strain: Not on file  Food Insecurity: Not on file   Transportation Needs: Not on file  Physical Activity: Not on file  Stress: Not on file  Social Connections: Not on file  Intimate Partner Violence: Not on file    History reviewed. No pertinent family history.   Vitals:   01/13/24 2038 01/13/24 2358 01/14/24 0429 01/14/24 0847  BP: (!) 145/81 136/84 (!) 159/82 136/79  Pulse: 74 66 67 64  Resp: 18 19 (!) 21   Temp: 98.5 F (36.9 C) 98.2 F (36.8 C) 98.8 F (37.1 C) 97.6 F (36.4 C)  TempSrc: Oral Oral Oral   SpO2: 98% 97% 96% 94%    PHYSICAL EXAM General: Well-appearing male, well nourished, in no acute distress. HEENT: Normocephalic and atraumatic. Neck: No JVD.   Lungs: Normal respiratory effort on room air.  Diminished breath sounds bilaterally Heart: Irregularly, irregular, controlled HR. Normal S1 and S2 without gallops or murmurs.  Abdomen: Non-distended appearing.  Msk: Normal strength and tone for age. Extremities: Warm and well perfused. No clubbing, cyanosis, edema.  Neuro: Alert and oriented X 3. Psych: Answers questions appropriately.   Labs: Basic Metabolic Panel: Recent Labs    01/13/24 0544 01/14/24 0304  NA 136 135  K 4.1 3.6  CL 98 102  CO2 26 22  GLUCOSE 142* 126*  BUN 19 16  CREATININE 1.02 0.80  CALCIUM  8.7* 8.4*  MG 1.9 1.7  PHOS 3.8 3.2   Liver Function Tests: Recent Labs    01/13/24 0544 01/14/24 0304  AST 32 95*  ALT 172* 212*  ALKPHOS 39 37*  BILITOT 1.7* 1.2  PROT 7.0 6.8  ALBUMIN 3.4* 3.0*   No results for input(s): LIPASE, AMYLASE in the last 72 hours.  CBC: Recent Labs    01/13/24 0544 01/14/24 0304  WBC 9.4 9.7  HGB 18.2* 16.5  HCT 55.1* 49.8  MCV 78.0* 78.1*  PLT 232 234   Cardiac Enzymes: No results for input(s): CKTOTAL, CKMB, CKMBINDEX, TROPONINIHS in the last 72 hours.  BNP: No results for input(s): BNP in the last 72 hours. D-Dimer: No results for input(s): DDIMER in the last 72 hours. Hemoglobin A1C: No results for input(s):  HGBA1C in the last 72 hours. Fasting Lipid Panel: No results for input(s): CHOL, HDL, LDLCALC, TRIG, CHOLHDL, LDLDIRECT in the last 72 hours. Thyroid Function Tests: No results for input(s): TSH, T4TOTAL, T3FREE, THYROIDAB in the last 72 hours.  Invalid input(s): FREET3 Anemia Panel: No results for input(s): VITAMINB12, FOLATE, FERRITIN, TIBC, IRON, RETICCTPCT in the last 72 hours.    Radiology: ECHOCARDIOGRAM COMPLETE Result Date: 01/12/2024    ECHOCARDIOGRAM REPORT   Patient Name:   Steven Mcintosh Date of Exam: 01/12/2024 Medical Rec #:  [id number]      Height:       66.5 in Accession #:  [id number]     Weight:       257.8 lb Date of Birth:  05-19-1971      BSA:          2.240 m Patient Age:    53 years       BP:           114/72 mmHg Patient Gender: M              HR:           131 bpm. Exam Location:  [facility] Procedure: 2D Echo, Cardiac Doppler and Color Doppler (Both Spectral and Color            Flow Doppler were utilized during procedure). Indications:     Atrial Fibrillation I48.91  History:         Patient has no prior history of Echocardiogram examinations.                  Risk Factors:Hypertension, Diabetes and Dyslipidemia.  Sonographer:     Terrilee Few RCS Referring Phys:  [id number] Althia Atlas Diagnosing Phys: Dwayne D Callwood MD IMPRESSIONS  1. Left ventricular ejection fraction, by estimation, is 65 to 70%. The left ventricle has normal function. The left ventricle has no regional wall motion abnormalities. Left ventricular diastolic parameters are consistent with Grade II diastolic dysfunction (pseudonormalization).  2. Right ventricular systolic function is normal. The right ventricular size is normal.  3. The mitral valve is normal in structure. No evidence of mitral valve regurgitation.  4. The aortic valve is normal in structure. Aortic valve regurgitation is not visualized. FINDINGS  Left Ventricle: Left ventricular ejection fraction, by  estimation, is 65 to 70%. The left ventricle has normal function. The left ventricle has no regional wall motion abnormalities. Global longitudinal strain performed but not reported based on interpreter judgement due to suboptimal tracking. The left ventricular internal cavity size was normal in size. There is no left ventricular hypertrophy. Left ventricular diastolic parameters are consistent with Grade II diastolic dysfunction (pseudonormalization). Right Ventricle: The right ventricular size is normal. No increase in right ventricular wall thickness. Right ventricular systolic function is normal. Left Atrium: Left atrial size was normal in size. Right Atrium: Right atrial size was normal in size. Pericardium: There is no evidence of pericardial effusion. Mitral Valve: The mitral valve is normal in structure. No evidence of mitral valve regurgitation. Tricuspid Valve: The tricuspid valve is normal in structure. Tricuspid valve regurgitation is trivial. Aortic Valve: The aortic valve is normal in structure. Aortic valve regurgitation is not visualized. Aortic valve peak gradient measures 10.2 mmHg. Pulmonic Valve: The pulmonic valve was normal in structure. Pulmonic valve regurgitation is not visualized. Aorta: The ascending aorta was not well visualized. IAS/Shunts: No atrial level shunt detected by color flow Doppler. Additional Comments: 3D was performed not requiring image post processing on an independent workstation and was indeterminate.  LEFT VENTRICLE PLAX 2D LVIDd:         4.80 cm   Diastology LVIDs:         3.00 cm   LV e' medial:    8.05 cm/s LV PW:         1.10 cm   LV E/e' medial:  10.4 LV IVS:        0.80 cm   LV e' lateral:   13.40 cm/s LVOT diam:     2.20 cm   LV E/e' lateral: 6.2 LV SV:  66 LV SV Index:   29 LVOT Area:     3.80 cm  RIGHT VENTRICLE RV S prime:     15.20 cm/s TAPSE (M-mode): 1.9 cm LEFT ATRIUM             Index        RIGHT ATRIUM           Index LA diam:        4.30 cm  1.92 cm/m   RA Area:     13.20 cm LA Vol (A2C):   52.9 ml 23.61 ml/m  RA Volume:   29.10 ml  12.99 ml/m LA Vol (A4C):   50.4 ml 22.50 ml/m LA Biplane Vol: 55.1 ml 24.60 ml/m  AORTIC VALVE AV Area (Vmax): 2.82 cm AV Vmax:        160.00 cm/s AV Peak Grad:   10.2 mmHg LVOT Vmax:      118.60 cm/s LVOT Vmean:     75.700 cm/s LVOT VTI:       0.173 m  AORTA Ao Root diam: 3.00 cm Ao Asc diam:  3.10 cm MITRAL VALVE MV Area (PHT): 5.48 cm    SHUNTS MV Decel Time: 139 msec    Systemic VTI:  0.17 m MV E velocity: 83.60 cm/s  Systemic Diam: 2.20 cm MV A velocity: 38.80 cm/s MV E/A ratio:  2.15 Dwayne D Callwood MD Electronically signed by Antonette Batters MD Signature Date/Time: 01/12/2024/10:14:28 PM    Final    US  Abdomen Limited RUQ (LIVER/GB) Result Date: 01/10/2024 CLINICAL DATA:  [id number] AKI (acute kidney injury) (HCC) [id number]; [id number] LFTs abnormal [id number] EXAM: ULTRASOUND ABDOMEN LIMITED RIGHT UPPER QUADRANT COMPARISON:  None Available. FINDINGS: Gallbladder: No gallstones, pericholecystic fluid or wall thickening visualized. Common bile duct: Diameter: 0.3 cm. Liver: Liver is hyperechoic consistent with fatty infiltration. No focal hepatic lesions identified. No intrahepatic ductal dilatation. Hepatopetal portal vein flow. IMPRESSION: Hepatic fatty infiltration. Otherwise unremarkable examination of the right upper quadrant. EXAM: RENAL / URINARY TRACT ULTRASOUND COMPLETE COMPARISON:  None Available. FINDINGS: The right kidney measured 12.5 cm and the left kidney measured 13.6 cm. The kidneys demonstrate normal echogenicity. No renal parenchymal lesions are identified. No shadowing stones are seen. No hydronephrosis. Bladder: Empty and not evaluable. IMPRESSION: Unremarkable renal ultrasound. Electronically Signed   By: Sydell Eva M.D.   On: 01/10/2024 10:29   US  RENAL Result Date: 01/10/2024 CLINICAL DATA:  [id number] AKI (acute kidney injury) (HCC) [id number]; [id number] LFTs abnormal [id number] EXAM: ULTRASOUND  ABDOMEN LIMITED RIGHT UPPER QUADRANT COMPARISON:  None Available. FINDINGS: Gallbladder: No gallstones, pericholecystic fluid or wall thickening visualized. Common bile duct: Diameter: 0.3 cm. Liver: Liver is hyperechoic consistent with fatty infiltration. No focal hepatic lesions identified. No intrahepatic ductal dilatation. Hepatopetal portal vein flow. IMPRESSION: Hepatic fatty infiltration. Otherwise unremarkable examination of the right upper quadrant. EXAM: RENAL / URINARY TRACT ULTRASOUND COMPLETE COMPARISON:  None Available. FINDINGS: The right kidney measured 12.5 cm and the left kidney measured 13.6 cm. The kidneys demonstrate normal echogenicity. No renal parenchymal lesions are identified. No shadowing stones are seen. No hydronephrosis. Bladder: Empty and not evaluable. IMPRESSION: Unremarkable renal ultrasound. Electronically Signed   By: Sydell Eva M.D.   On: 01/10/2024 10:29    ECHO as above.  TELEMETRY reviewed by me 01/14/2024: Paroxsymal AF rate 60-70s  EKG reviewed by me: Sinus tachycardia rate 104 bpm  Data reviewed by me 01/14/2024: last 24h vitals tele labs imaging I/O hospitalist progress note  Principal Problem:  AKI (acute kidney injury) (HCC) Active Problems:   Diarrhea   LFT elevation    ASSESSMENT AND PLAN:  Steven Mcintosh is a 53 y.o. male  with a past medical history of hypertension, hyperlipidemia, IIDM, fatty liver, hepatitis C, obesity who presented to the ED on 01/10/2024 for persistent nausea, vomiting, diarrhea and generalized bodyaches.  On 06/15 around 1700 telemetry showed patient was in atrial fibrillation with RVR rates in the 140s.  Patient converted into sinus rhythm s/p 1 dose of IV metoprolol tartrate 5 mg and 1 dose of IV digoxin 0.125.  Patient reports no known history of paroxysmal atrial fibrillation.  Cardiology was consulted for further evaluation.   # Sepsis secondary to acute gastroenteritis by E Coli. # History of hepatitis C, fatty  liver # Atrial fibrillation RVR # Paroxsymal atrial fibrillation # Hypertension # Hyperlipidemia Patient with no known history of atrial fibrillation presents with sxs of gastroenteritis. Tele on 06/15 at around 1700 revealed atrial fibrillation RVR rate in 140s. Echo this admission with pEF, no RWMA, grade II diastolic dysfunction. Per tele patient in/out of AF rates 100-110s.  -Monitor and replenish electrolytes for a goal K >4, Mag >2  -Continue metoprolol tartrate to 75 mg twice daily. -Transition IV to PO amio 200 mg BID for 10 days then 200 mg daily.         -Monitor LFTs.  -Continue PO Cardizem to 240 mg daily. May uptitarte as BP allows -CHA2DS2VASc score of 2 (in the setting of HTN, diabetes history). Would recommend starting anticoagulation for stroke risk reduction. Patient denies any recent falls, or prior major bleeding.  -Continue Eliquis 5 mg twice daily for stroke risk reduction.  -Continue aspirin 81 mg, atorvastatin  40 mg daily. -Recommend outpatient EP referral.  -Likely discharge tomorrow.    This patient's plan of care was discussed and created with Dr. Beau Bound and he is in agreement.  Signed: Creighton Doffing, PA-C  01/14/2024, 12:01 PM [facility] Cardiology

## 2024-01-15 ENCOUNTER — Telehealth: Payer: Self-pay

## 2024-01-15 LAB — PARASITE EXAM, BLOOD

## 2024-01-15 LAB — CULTURE, BLOOD (ROUTINE X 2)
Culture: NO GROWTH
Culture: NO GROWTH

## 2024-01-15 NOTE — Telephone Encounter (Signed)
 Patient LM asking for call back, tried to call the patient back but V[facility] is full and call went straight to VM

## 2024-01-18 ENCOUNTER — Ambulatory Visit: Admitting: Cardiovascular Disease

## 2024-01-18 ENCOUNTER — Encounter: Payer: Self-pay | Admitting: Cardiovascular Disease

## 2024-01-18 VITALS — BP 131/78 | HR 73 | Ht 66.0 in | Wt 252.8 lb

## 2024-01-18 DIAGNOSIS — I10 Essential (primary) hypertension: Secondary | ICD-10-CM

## 2024-01-18 DIAGNOSIS — R0602 Shortness of breath: Secondary | ICD-10-CM

## 2024-01-18 DIAGNOSIS — E782 Mixed hyperlipidemia: Secondary | ICD-10-CM

## 2024-01-18 DIAGNOSIS — E119 Type 2 diabetes mellitus without complications: Secondary | ICD-10-CM

## 2024-01-18 DIAGNOSIS — F17210 Nicotine dependence, cigarettes, uncomplicated: Secondary | ICD-10-CM

## 2024-01-18 DIAGNOSIS — Z8679 Personal history of other diseases of the circulatory system: Secondary | ICD-10-CM

## 2024-01-18 DIAGNOSIS — E669 Obesity, unspecified: Secondary | ICD-10-CM

## 2024-01-18 NOTE — Progress Notes (Signed)
 Cardiology Office Note   Date:  01/18/2024   ID:  Steven Mcintosh, DOB Aug 21, 1970, MRN [id number]  PCP:  Albina GORMAN Dine, MD  Cardiologist:  Denyse Bathe, MD      History of Present Illness: Steven Mcintosh is a 53 y.o. male who presents for No chief complaint on file.   This is a 53 year old African-American male with a past medical history of hypertension diabetes who presented to the hospital on June 15 with gastroenteritis and was found to have a E. coli infection.  At that time he had a EKG done which showed atrial fibrillation with rapid ventricular response rate about 136 bpm.  Patient denied any palpitation or dizziness and was apparently dehydrated and got better with hydration.  Because of atrial fibrillation with rapid ventricular response rate he was started on Eliquis  5 twice daily along with Cardizem  and amiodarone .  Patient states that he is having problems with Eliquis  because of bruising around his face.  I advised that he stop taking aspirin .  His [place] Vas score is 2.  Thus he needs to be on Eliquis .  Since discharge he has been in sinus rhythm.      Past Medical History:  Diagnosis Date   Diabetes mellitus without complication (HCC)    Hypercholesteremia unk   Hypertension      Past Surgical History:  Procedure Laterality Date   DIAPHRAGM SURGERY     ENUCLEATION Left      Current Outpatient Medications  Medication Sig Dispense Refill   amiodarone  (PACERONE ) 200 MG tablet Take 1 tablet (200 mg total) by mouth 2 (two) times daily for 10 days, THEN 1 tablet (200 mg total) daily. 110 tablet 0   apixaban  (ELIQUIS ) 5 MG TABS tablet Take 1 tablet (5 mg total) by mouth 2 (two) times daily. 60 tablet 11   atorvastatin  (LIPITOR) 40 MG tablet TAKE 1 TABLET BY MOUTH ONCE DAILY IN THE EVENING 90 tablet 0   cyclobenzaprine  (FLEXERIL ) 10 MG tablet TAKE 1 TABLET BY MOUTH THREE TIMES DAILY AS NEEDED FOR  NECK  PAIN 90 tablet 0   diltiazem  (CARDIZEM  CD) 240 MG 24 hr  capsule Take 1 capsule (240 mg total) by mouth daily. 30 capsule 11   Intimacy Products (EROXON) GEL Apply 1 Package topically as directed for 4 doses. Apply one tube to glans penis prior to intercourse 1 each 1   INVOKANA  300 MG TABS tablet Take 1 tablet (300 mg total) by mouth every morning. 90 tablet 0   metFORMIN  (GLUCOPHAGE -XR) 500 MG 24 hr tablet Take 1 tablet (500 mg total) by mouth daily with breakfast. 360 tablet 0   metoprolol  tartrate (LOPRESSOR ) 50 MG tablet Take 1.5 tablets (75 mg total) by mouth 2 (two) times daily. 90 tablet 11   OZEMPIC , 2 MG/DOSE, 8 MG/3ML SOPN INJECT 2MG   SUBCUTANEOUSLY ONCE A WEEK 3 mL 2   pantoprazole  (PROTONIX ) 40 MG tablet Take 1 tablet (40 mg total) by mouth daily. 30 tablet 0   sildenafil  (VIAGRA ) 100 MG tablet Take 1 tablet (100 mg total) by mouth as needed for erectile dysfunction. 15 tablet 2   traZODone  (DESYREL ) 50 MG tablet Take 1 tablet (50 mg total) by mouth at bedtime. 30 tablet 1   [START ON 01/19/2024] Vitamin D , Ergocalciferol , (DRISDOL ) 1.25 MG (50000 UNIT) CAPS capsule Take 1 capsule (50,000 Units total) by mouth every 7 (seven) days. 12 capsule 0   No current facility-administered medications for this visit.  Allergies:   Patient has no known allergies.    Social History:   reports that he has been smoking. He does not have any smokeless tobacco history on file. He reports current alcohol use. He reports that he does not currently use drugs.   Family History:  family history is not on file.    ROS:     ROS    All other systems are reviewed and negative.    PHYSICAL EXAM: VS:  BP 131/78   Pulse 73   Ht 5' 6 (1.676 m)   Wt 252 lb 12.8 oz (114.7 kg)   SpO2 99%   BMI 40.80 kg/m  , BMI Body mass index is 40.8 kg/m. Last weight:  Wt Readings from Last 3 Encounters:  01/18/24 252 lb 12.8 oz (114.7 kg)  11/30/23 257 lb 12.8 oz (116.9 kg)  06/24/23 253 lb (114.8 kg)     Physical Exam Vitals reviewed.  Constitutional:       Appearance: Normal appearance. He is normal weight.  HENT:     Head: Normocephalic.     Nose: Nose normal.     Mouth/Throat:     Mouth: Mucous membranes are moist.   Eyes:     Pupils: Pupils are equal, round, and reactive to light.    Cardiovascular:     Rate and Rhythm: Normal rate and regular rhythm.     Pulses: Normal pulses.     Heart sounds: Normal heart sounds.  Pulmonary:     Effort: Pulmonary effort is normal.  Abdominal:     General: Abdomen is flat. Bowel sounds are normal.   Musculoskeletal:        General: Normal range of motion.     Cervical back: Normal range of motion.   Skin:    General: Skin is warm.   Neurological:     General: No focal deficit present.     Mental Status: He is alert.   Psychiatric:        Mood and Affect: Mood normal.       EKG: Atrial fibrillation with rapid ventricular response rate of 136 bpm.  Recent Labs: 03/16/2023: TSH 1.720 01/14/2024: ALT 212; BUN 16; Creatinine, Ser 0.80; Hemoglobin 16.5; Magnesium  1.7; Platelets 234; Potassium 3.6; Sodium 135    Lipid Panel    Component Value Date/Time   CHOL 143 11/27/2023 1000   TRIG 82 11/27/2023 1000   HDL 36 (L) 11/27/2023 1000   CHOLHDL 4.0 11/27/2023 1000   LDLCALC 91 11/27/2023 1000      Other studies Reviewed: Additional studies/ records that were reviewed today include:  Review of the above records demonstrates:       No data to display            ASSESSMENT AND PLAN:    ICD-10-CM   1. Atrial fibrillation, currently in sinus rhythm  Z86.79 MYOCARDIAL PERFUSION IMAGING   Advise continuing Cardizem  and p.o. amiodarone .  Currently in sinus rhythm.  Advise Eliquis  for stroke prevention.  [place] Vascor is 2.    2. Type 2 diabetes mellitus without complication, without long-term current use of insulin  (HCC)  E11.9 MYOCARDIAL PERFUSION IMAGING    3. Primary hypertension  I10 MYOCARDIAL PERFUSION IMAGING   Blood pressure is reasonably controlled.    4. Mixed  hyperlipidemia  E78.2 MYOCARDIAL PERFUSION IMAGING    5. Obesity (BMI 35.0-39.9 without comorbidity)  E66.9 MYOCARDIAL PERFUSION IMAGING    6. SOB (shortness of breath)  R06.02 MYOCARDIAL PERFUSION IMAGING  Patient probably has either sleep apnea or coronary artery disease as the cause of atrial fibrillation.  Advise getting a nuclear stress test and sleep study.       Problem List Items Addressed This Visit       Cardiovascular and Mediastinum   Primary hypertension   Relevant Orders   MYOCARDIAL PERFUSION IMAGING     Endocrine   Type 2 diabetes mellitus without complication (HCC)   Relevant Orders   MYOCARDIAL PERFUSION IMAGING     Other   Mixed hyperlipidemia   Relevant Orders   MYOCARDIAL PERFUSION IMAGING   Other Visit Diagnoses       Atrial fibrillation, currently in sinus rhythm    -  Primary   Advise continuing Cardizem  and p.o. amiodarone .  Currently in sinus rhythm.  Advise Eliquis  for stroke prevention.  [place] Vascor is 2.   Relevant Orders   MYOCARDIAL PERFUSION IMAGING     Obesity (BMI 35.0-39.9 without comorbidity)       Relevant Orders   MYOCARDIAL PERFUSION IMAGING     SOB (shortness of breath)       Patient probably has either sleep apnea or coronary artery disease as the cause of atrial fibrillation.  Advise getting a nuclear stress test and sleep study.   Relevant Orders   MYOCARDIAL PERFUSION IMAGING          Disposition:   Return in about 2 weeks (around 02/01/2024) for stress tets and f/u.    Total time spent: 50 minutes  Signed,  Denyse Bathe, MD  01/18/2024 10:58 AM    [facility]

## 2024-01-21 ENCOUNTER — Other Ambulatory Visit

## 2024-01-21 ENCOUNTER — Other Ambulatory Visit: Payer: Self-pay | Admitting: Internal Medicine

## 2024-01-21 DIAGNOSIS — E559 Vitamin D deficiency, unspecified: Secondary | ICD-10-CM

## 2024-01-21 DIAGNOSIS — N179 Acute kidney failure, unspecified: Secondary | ICD-10-CM

## 2024-01-21 DIAGNOSIS — D519 Vitamin B12 deficiency anemia, unspecified: Secondary | ICD-10-CM

## 2024-01-22 ENCOUNTER — Ambulatory Visit: Admitting: Internal Medicine

## 2024-01-22 ENCOUNTER — Ambulatory Visit: Payer: Self-pay | Admitting: Internal Medicine

## 2024-01-22 ENCOUNTER — Encounter: Payer: Self-pay | Admitting: Internal Medicine

## 2024-01-22 VITALS — BP 122/79 | HR 82 | Temp 98.2°F | Ht 66.0 in | Wt 248.6 lb

## 2024-01-22 DIAGNOSIS — R7989 Other specified abnormal findings of blood chemistry: Secondary | ICD-10-CM

## 2024-01-22 DIAGNOSIS — E559 Vitamin D deficiency, unspecified: Secondary | ICD-10-CM

## 2024-01-22 DIAGNOSIS — E119 Type 2 diabetes mellitus without complications: Secondary | ICD-10-CM

## 2024-01-22 DIAGNOSIS — M545 Low back pain, unspecified: Secondary | ICD-10-CM

## 2024-01-22 DIAGNOSIS — R0683 Snoring: Secondary | ICD-10-CM | POA: Diagnosis not present

## 2024-01-22 DIAGNOSIS — Z0130 Encounter for examination of blood pressure without abnormal findings: Secondary | ICD-10-CM

## 2024-01-22 DIAGNOSIS — I480 Paroxysmal atrial fibrillation: Secondary | ICD-10-CM

## 2024-01-22 LAB — CBC WITH DIFF/PLATELET
Basophils Absolute: 0.1 10*3/uL (ref 0.0–0.2)
Basos: 1 %
EOS (ABSOLUTE): 0.1 10*3/uL (ref 0.0–0.4)
Eos: 1 %
Hematocrit: 53 % — ABNORMAL HIGH (ref 37.5–51.0)
Hemoglobin: 17.3 g/dL (ref 13.0–17.7)
Immature Grans (Abs): 0.1 10*3/uL (ref 0.0–0.1)
Immature Granulocytes: 1 %
Lymphocytes Absolute: 2 10*3/uL (ref 0.7–3.1)
Lymphs: 19 %
MCH: 25.9 pg — ABNORMAL LOW (ref 26.6–33.0)
MCHC: 32.6 g/dL (ref 31.5–35.7)
MCV: 80 fL (ref 79–97)
Monocytes Absolute: 0.9 10*3/uL (ref 0.1–0.9)
Monocytes: 9 %
Neutrophils Absolute: 7.1 10*3/uL — ABNORMAL HIGH (ref 1.4–7.0)
Neutrophils: 69 %
Platelets: 279 10*3/uL (ref 150–450)
RBC: 6.67 x10E6/uL — ABNORMAL HIGH (ref 4.14–5.80)
RDW: 16.4 % — ABNORMAL HIGH (ref 11.6–15.4)
WBC: 10.2 10*3/uL (ref 3.4–10.8)

## 2024-01-22 LAB — HEMOGLOBIN A1C
Est. average glucose Bld gHb Est-mCnc: 146 mg/dL
Hgb A1c MFr Bld: 6.7 % — ABNORMAL HIGH (ref 4.8–5.6)

## 2024-01-22 LAB — LIPID PANEL
Chol/HDL Ratio: 2.4 ratio (ref 0.0–5.0)
Cholesterol, Total: 65 mg/dL — ABNORMAL LOW (ref 100–199)
HDL: 27 mg/dL — ABNORMAL LOW (ref >39)
LDL Chol Calc (NIH): 26 mg/dL (ref 0–99)
Triglycerides: 44 mg/dL (ref 0–149)
VLDL Cholesterol Cal: 12 mg/dL (ref 5–40)

## 2024-01-22 LAB — COMPREHENSIVE METABOLIC PANEL WITH GFR
ALT: 62 IU/L — ABNORMAL HIGH (ref 0–44)
AST: 18 IU/L (ref 0–40)
Albumin: 4 g/dL (ref 3.8–4.9)
Alkaline Phosphatase: 54 IU/L (ref 44–121)
BUN/Creatinine Ratio: 14 (ref 9–20)
BUN: 12 mg/dL (ref 6–24)
Bilirubin Total: 0.5 mg/dL (ref 0.0–1.2)
CO2: 23 mmol/L (ref 20–29)
Calcium: 8.9 mg/dL (ref 8.7–10.2)
Chloride: 98 mmol/L (ref 96–106)
Creatinine, Ser: 0.86 mg/dL (ref 0.76–1.27)
Globulin, Total: 3 g/dL (ref 1.5–4.5)
Glucose: 111 mg/dL — ABNORMAL HIGH (ref 70–99)
Potassium: 4.4 mmol/L (ref 3.5–5.2)
Sodium: 139 mmol/L (ref 134–144)
Total Protein: 7 g/dL (ref 6.0–8.5)
eGFR: 103 mL/min/{1.73_m2} (ref >59)

## 2024-01-22 LAB — POCT CBG (FASTING - GLUCOSE)-MANUAL ENTRY: Glucose Fasting, POC: 123 mg/dL — AB (ref 70–99)

## 2024-01-22 LAB — PSA: Prostate Specific Ag, Serum: 1 ng/mL (ref 0.0–4.0)

## 2024-01-22 MED ORDER — CELECOXIB 200 MG PO CAPS: 200.0000 mg | ORAL_CAPSULE | Freq: Two times a day (BID) | ORAL | 2 refills | Status: AC

## 2024-01-22 MED ORDER — VITAMIN D (CHOLECALCIFEROL) 50 MCG (2000 UT) PO CAPS: 1.0000 | ORAL_CAPSULE | Freq: Every day | ORAL | 0 refills | Status: DC

## 2024-01-22 MED ORDER — CHOLECALCIFEROL 1.25 MG (50000 UT) PO TABS: 1.0000 | ORAL_TABLET | ORAL | 0 refills | Status: AC

## 2024-01-22 NOTE — Progress Notes (Signed)
 Established Patient Office Visit  Subjective:  Patient ID: Steven Mcintosh, male    DOB: 07/18/1971  Age: 53 y.o. MRN: [id number]  Chief Complaint  Patient presents with   Follow-up    Hospital f/u for sepsis secondary to E Coli complicated by afib with rvr, aki and transaminitis. No other complaints, here for lab review and medication refills. Labs reviewed and notable for well controlled/uncontrolled diabetes, A1c at target, lipids at target with cmp notable for improvement in transaminases and normalization of renal function. Denies any hypoglycemic episodes and home bg readings have been at target.     No other concerns at this time.   Past Medical History:  Diagnosis Date   Diabetes mellitus without complication (HCC)    Hypercholesteremia unk   Hypertension     Past Surgical History:  Procedure Laterality Date   DIAPHRAGM SURGERY     ENUCLEATION Left     Social History   Socioeconomic History   Marital status: Single    Spouse name: Not on file   Number of children: Not on file   Years of education: Not on file   Highest education level: Not on file  Occupational History   Not on file  Tobacco Use   Smoking status: Every Day   Smokeless tobacco: Not on file  Substance and Sexual Activity   Alcohol use: Yes   Drug use: Not Currently   Sexual activity: Yes  Other Topics Concern   Not on file  Social History Narrative   Not on file   Social Drivers of Health   Financial Resource Strain: Not on file  Food Insecurity: Not on file  Transportation Needs: Not on file  Physical Activity: Not on file  Stress: Not on file  Social Connections: Not on file  Intimate Partner Violence: Not on file    No family history on file.  No Known Allergies  Outpatient Medications Prior to Visit  Medication Sig   amiodarone  (PACERONE ) 200 MG tablet Take 1 tablet (200 mg total) by mouth 2 (two) times daily for 10 days, THEN 1 tablet (200 mg total) daily.   apixaban   (ELIQUIS ) 5 MG TABS tablet Take 1 tablet (5 mg total) by mouth 2 (two) times daily.   atorvastatin  (LIPITOR) 40 MG tablet TAKE 1 TABLET BY MOUTH ONCE DAILY IN THE EVENING   cyclobenzaprine  (FLEXERIL ) 10 MG tablet TAKE 1 TABLET BY MOUTH THREE TIMES DAILY AS NEEDED FOR  NECK  PAIN   diltiazem  (CARDIZEM  CD) 240 MG 24 hr capsule Take 1 capsule (240 mg total) by mouth daily.   Intimacy Products (EROXON) GEL Apply 1 Package topically as directed for 4 doses. Apply one tube to glans penis prior to intercourse   INVOKANA  300 MG TABS tablet Take 1 tablet (300 mg total) by mouth every morning.   metFORMIN  (GLUCOPHAGE -XR) 500 MG 24 hr tablet Take 1 tablet (500 mg total) by mouth daily with breakfast.   metoprolol  tartrate (LOPRESSOR ) 50 MG tablet Take 1.5 tablets (75 mg total) by mouth 2 (two) times daily.   OZEMPIC , 2 MG/DOSE, 8 MG/3ML SOPN INJECT 2MG   SUBCUTANEOUSLY ONCE A WEEK   pantoprazole  (PROTONIX ) 40 MG tablet Take 1 tablet (40 mg total) by mouth daily.   sildenafil  (VIAGRA ) 100 MG tablet Take 1 tablet (100 mg total) by mouth as needed for erectile dysfunction.   traZODone  (DESYREL ) 50 MG tablet Take 1 tablet (50 mg total) by mouth at bedtime.   Vitamin D , Ergocalciferol , (DRISDOL )  1.25 MG (50000 UNIT) CAPS capsule Take 1 capsule (50,000 Units total) by mouth every 7 (seven) days.   No facility-administered medications prior to visit.    Review of Systems  Constitutional: Negative.   HENT: Negative.    Eyes: Negative.   Respiratory: Negative.    Cardiovascular: Negative.   Gastrointestinal: Negative.   Genitourinary: Negative.   Skin: Negative.   Neurological: Negative.   Endo/Heme/Allergies: Negative.        Objective:   BP 122/79   Pulse 82   Temp 98.2 F (36.8 C)   Ht 5' 6 (1.676 m)   Wt 248 lb 9.6 oz (112.8 kg)   SpO2 96%   BMI 40.13 kg/m   Vitals:   01/22/24 1527  BP: 122/79  Pulse: 82  Temp: 98.2 F (36.8 C)  Height: 5' 6 (1.676 m)  Weight: 248 lb 9.6 oz (112.8  kg)  SpO2: 96%  BMI (Calculated): 40.14    Physical Exam Vitals reviewed.  Constitutional:      Appearance: Normal appearance. He is obese.  HENT:     Head: Normocephalic.     Left Ear: There is no impacted cerumen.     Nose: Nose normal.     Mouth/Throat:     Mouth: Mucous membranes are moist.     Pharynx: No posterior oropharyngeal erythema.   Eyes:     Extraocular Movements: Extraocular movements intact.     Pupils: Pupils are equal, round, and reactive to light.    Cardiovascular:     Rate and Rhythm: Regular rhythm.     Chest Wall: PMI is not displaced.     Pulses: Normal pulses.     Heart sounds: Normal heart sounds. No murmur heard. Pulmonary:     Effort: Pulmonary effort is normal.     Breath sounds: Normal air entry. No rhonchi or rales.  Abdominal:     General: Abdomen is flat. Bowel sounds are normal. There is no distension.     Palpations: Abdomen is soft. There is no hepatomegaly, splenomegaly or mass.     Tenderness: There is no abdominal tenderness.   Musculoskeletal:        General: Normal range of motion.     Cervical back: Normal range of motion and neck supple.     Right lower leg: No edema.     Left lower leg: No edema.   Skin:    General: Skin is warm and dry.   Neurological:     General: No focal deficit present.     Mental Status: He is alert and oriented to person, place, and time.     Cranial Nerves: No cranial nerve deficit.     Motor: No weakness.   Psychiatric:        Mood and Affect: Mood normal.        Behavior: Behavior normal.      Results for orders placed or performed in visit on 01/22/24  POCT CBG (Fasting - Glucose)  Result Value Ref Range   Glucose Fasting, POC 123 (A) 70 - 99 mg/dL        Assessment & Plan:  As per problem list.  Problem List Items Addressed This Visit       Endocrine   Type 2 diabetes mellitus without complication (HCC) - Primary   Relevant Orders   POCT CBG (Fasting - Glucose)  (Completed)     Other   Lumbar pain   Relevant Medications   celecoxib (CELEBREX) 200  MG capsule   Other Visit Diagnoses       Snoring       Relevant Orders   Ambulatory referral to Sleep Studies     Paroxysmal atrial fibrillation (HCC)         Abnormal liver function test       Relevant Orders   Comprehensive metabolic panel with GFR       Return in about 6 weeks (around 03/04/2024) for fu with labs prior.   Total time spent: 30 minutes  Sherrill Cinderella Perry, MD  01/22/2024   This document may have been prepared by [facility] Voice Recognition software and as such may include unintentional dictation errors.

## 2024-01-26 ENCOUNTER — Ambulatory Visit: Payer: Self-pay | Admitting: Internal Medicine

## 2024-01-26 LAB — IRON,TIBC AND FERRITIN PANEL
Ferritin: 726 ng/mL — ABNORMAL HIGH (ref 30–400)
Iron Saturation: 38 % (ref 15–55)
Iron: 111 ug/dL (ref 38–169)
Total Iron Binding Capacity: 295 ug/dL (ref 250–450)
UIBC: 184 ug/dL (ref 111–343)

## 2024-01-26 LAB — VITAMIN D 25 HYDROXY (VIT D DEFICIENCY, FRACTURES): Vit D, 25-Hydroxy: 22.6 ng/mL — ABNORMAL LOW (ref 30.0–100.0)

## 2024-01-26 LAB — VITAMIN B12: Vitamin B-12: >2000 pg/mL — ABNORMAL HIGH (ref 232–1245)

## 2024-01-26 NOTE — Telephone Encounter (Signed)
 Patient left VM requesting to speak to [facility].

## 2024-01-28 ENCOUNTER — Other Ambulatory Visit: Payer: Self-pay | Admitting: Internal Medicine

## 2024-01-28 DIAGNOSIS — E119 Type 2 diabetes mellitus without complications: Secondary | ICD-10-CM

## 2024-02-04 ENCOUNTER — Encounter

## 2024-02-12 ENCOUNTER — Ambulatory Visit: Admitting: Cardiovascular Disease

## 2024-02-12 ENCOUNTER — Other Ambulatory Visit: Payer: Self-pay

## 2024-02-12 MED ORDER — DILTIAZEM HCL ER COATED BEADS 240 MG PO CP24: 240.0000 mg | ORAL_CAPSULE | Freq: Every day | ORAL | 0 refills | Status: DC

## 2024-02-12 MED ORDER — METOPROLOL TARTRATE 50 MG PO TABS: 75.0000 mg | ORAL_TABLET | Freq: Two times a day (BID) | ORAL | 0 refills | Status: DC

## 2024-02-15 ENCOUNTER — Other Ambulatory Visit: Payer: Self-pay

## 2024-02-16 ENCOUNTER — Other Ambulatory Visit: Payer: Self-pay

## 2024-02-16 DIAGNOSIS — E119 Type 2 diabetes mellitus without complications: Secondary | ICD-10-CM

## 2024-02-17 MED ORDER — ATORVASTATIN CALCIUM 40 MG PO TABS: 40.0000 mg | ORAL_TABLET | Freq: Every evening | ORAL | 0 refills | Status: DC

## 2024-02-17 MED ORDER — PANTOPRAZOLE SODIUM 40 MG PO TBEC: 40.0000 mg | DELAYED_RELEASE_TABLET | Freq: Every day | ORAL | 0 refills | Status: DC

## 2024-02-26 ENCOUNTER — Other Ambulatory Visit: Payer: Self-pay | Admitting: Internal Medicine

## 2024-02-26 DIAGNOSIS — E119 Type 2 diabetes mellitus without complications: Secondary | ICD-10-CM

## 2024-02-27 ENCOUNTER — Other Ambulatory Visit: Payer: Self-pay | Admitting: Cardiovascular Disease

## 2024-03-04 ENCOUNTER — Ambulatory Visit: Admitting: Internal Medicine

## 2024-03-07 ENCOUNTER — Other Ambulatory Visit: Payer: PRIVATE HEALTH INSURANCE

## 2024-03-07 DIAGNOSIS — N179 Acute kidney failure, unspecified: Secondary | ICD-10-CM

## 2024-03-08 ENCOUNTER — Encounter: Payer: Self-pay | Admitting: Internal Medicine

## 2024-03-08 ENCOUNTER — Ambulatory Visit (INDEPENDENT_AMBULATORY_CARE_PROVIDER_SITE_OTHER): Admitting: Internal Medicine

## 2024-03-08 VITALS — BP 152/90 | HR 75 | Ht 66.0 in | Wt 251.4 lb

## 2024-03-08 DIAGNOSIS — N400 Enlarged prostate without lower urinary tract symptoms: Secondary | ICD-10-CM | POA: Diagnosis not present

## 2024-03-08 DIAGNOSIS — E782 Mixed hyperlipidemia: Secondary | ICD-10-CM

## 2024-03-08 DIAGNOSIS — I10 Essential (primary) hypertension: Secondary | ICD-10-CM

## 2024-03-08 DIAGNOSIS — E119 Type 2 diabetes mellitus without complications: Secondary | ICD-10-CM | POA: Diagnosis not present

## 2024-03-08 DIAGNOSIS — F5101 Primary insomnia: Secondary | ICD-10-CM

## 2024-03-08 DIAGNOSIS — N521 Erectile dysfunction due to diseases classified elsewhere: Secondary | ICD-10-CM

## 2024-03-08 LAB — CK: Total CK: 186 U/L (ref 41–331)

## 2024-03-08 LAB — POCT CBG (FASTING - GLUCOSE)-MANUAL ENTRY: Glucose Fasting, POC: 150 mg/dL — AB (ref 70–99)

## 2024-03-08 MED ORDER — SILDENAFIL CITRATE 100 MG PO TABS: 100.0000 mg | ORAL_TABLET | ORAL | 2 refills | Status: DC | PRN

## 2024-03-08 MED ORDER — TRAZODONE HCL 50 MG PO TABS: 50.0000 mg | ORAL_TABLET | Freq: Every day | ORAL | 1 refills | Status: DC

## 2024-03-08 MED ORDER — INVOKANA 300 MG PO TABS
300.0000 mg | ORAL_TABLET | Freq: Every morning | ORAL | 0 refills | Status: DC
Start: 2024-03-08 — End: 2024-05-03

## 2024-03-08 NOTE — Progress Notes (Signed)
 Established Patient Office Visit  Subjective:  Patient ID: Steven Mcintosh, male    DOB: Dec 16, 1970  Age: 53 y.o. MRN: [id number]  Chief Complaint  Patient presents with   Results    3 month lab results     No new complaints, here for CPE, lab review and medication refills. Labs reviewed and notable for normal CK. Denies any hypoglycemic episodes and home bg readings have been at target.     No other concerns at this time.   Past Medical History:  Diagnosis Date   Diabetes mellitus without complication (HCC)    Hypercholesteremia unk   Hypertension     Past Surgical History:  Procedure Laterality Date   DIAPHRAGM SURGERY     ENUCLEATION Left     Social History   Socioeconomic History   Marital status: Single    Spouse name: Not on file   Number of children: Not on file   Years of education: Not on file   Highest education level: Not on file  Occupational History   Not on file  Tobacco Use   Smoking status: Every Day    Types: Cigarettes   Smokeless tobacco: Not on file  Substance and Sexual Activity   Alcohol use: Yes   Drug use: Not Currently   Sexual activity: Yes  Other Topics Concern   Not on file  Social History Narrative   Not on file   Social Drivers of Health   Financial Resource Strain: Not on file  Food Insecurity: Not on file  Transportation Needs: Not on file  Physical Activity: Not on file  Stress: Not on file  Social Connections: Not on file  Intimate Partner Violence: Not on file    No family history on file.  No Known Allergies  Outpatient Medications Prior to Visit  Medication Sig   amiodarone  (PACERONE ) 200 MG tablet Take 1 tablet (200 mg total) by mouth 2 (two) times daily for 10 days, THEN 1 tablet (200 mg total) daily.   apixaban  (ELIQUIS ) 5 MG TABS tablet Take 1 tablet (5 mg total) by mouth 2 (two) times daily.   atorvastatin  (LIPITOR) 40 MG tablet Take 1 tablet (40 mg total) by mouth every evening.   CARTIA  XT 240 MG 24  hr capsule Take 1 capsule by mouth once daily   celecoxib  (CELEBREX ) 200 MG capsule Take 1 capsule (200 mg total) by mouth 2 (two) times daily.   Cholecalciferol  (VITAMIN D3) 50 MCG (2000 UT) CAPS Take 1 capsule (2,000 Units total) by mouth daily in the afternoon.   metFORMIN  (GLUCOPHAGE -XR) 500 MG 24 hr tablet Take 1 tablet by mouth once daily with breakfast   metoprolol  tartrate (LOPRESSOR ) 50 MG tablet Take 1.5 tablets (75 mg total) by mouth 2 (two) times daily.   OZEMPIC , 2 MG/DOSE, 8 MG/3ML SOPN INJECT 2MG  SUBUCTANEOUSLY ONCE A WEEK   pantoprazole  (PROTONIX ) 40 MG tablet Take 1 tablet (40 mg total) by mouth daily.   pioglitazone  (ACTOS ) 15 MG tablet Take 15 mg by mouth daily.   traZODone  (DESYREL ) 50 MG tablet Take 1 tablet (50 mg total) by mouth at bedtime.   Vitamin D , Ergocalciferol , (DRISDOL ) 1.25 MG (50000 UNIT) CAPS capsule Take 1 capsule (50,000 Units total) by mouth every 7 (seven) days.   cyclobenzaprine  (FLEXERIL ) 10 MG tablet TAKE 1 TABLET BY MOUTH THREE TIMES DAILY AS NEEDED FOR  NECK  PAIN (Patient not taking: Reported on 03/08/2024)   Intimacy Products (EROXON) GEL Apply 1 Package topically  as directed for 4 doses. Apply one tube to glans penis prior to intercourse (Patient not taking: Reported on 03/08/2024)   sildenafil  (VIAGRA ) 100 MG tablet Take 1 tablet (100 mg total) by mouth as needed for erectile dysfunction.   [DISCONTINUED] INVOKANA  300 MG TABS tablet Take 1 tablet (300 mg total) by mouth every morning. (Patient not taking: Reported on 03/08/2024)   No facility-administered medications prior to visit.    Review of Systems  Constitutional: Negative.   HENT: Negative.    Eyes: Negative.   Respiratory: Negative.    Cardiovascular: Negative.   Gastrointestinal: Negative.   Genitourinary: Negative.   Skin: Negative.   Neurological: Negative.   Endo/Heme/Allergies: Negative.        Objective:   BP (!) 152/90   Pulse 75   Ht 5' 6 (1.676 m)   Wt 251 lb 6.4 oz  (114 kg)   SpO2 95%   BMI 40.58 kg/m   Vitals:   03/08/24 0951  BP: (!) 152/90  Pulse: 75  Height: 5' 6 (1.676 m)  Weight: 251 lb 6.4 oz (114 kg)  SpO2: 95%  BMI (Calculated): 40.6    Physical Exam Vitals reviewed.  Constitutional:      Appearance: Normal appearance. He is obese.  HENT:     Head: Normocephalic.     Left Ear: There is no impacted cerumen.     Nose: Nose normal.     Mouth/Throat:     Mouth: Mucous membranes are moist.     Pharynx: No posterior oropharyngeal erythema.  Eyes:     Extraocular Movements: Extraocular movements intact.     Pupils: Pupils are equal, round, and reactive to light.  Cardiovascular:     Rate and Rhythm: Regular rhythm.     Chest Wall: PMI is not displaced.     Pulses: Normal pulses.     Heart sounds: Normal heart sounds. No murmur heard. Pulmonary:     Effort: Pulmonary effort is normal.     Breath sounds: Normal air entry. No rhonchi or rales.  Abdominal:     General: Abdomen is flat. Bowel sounds are normal. There is no distension.     Palpations: Abdomen is soft. There is no hepatomegaly, splenomegaly or mass.     Tenderness: There is no abdominal tenderness.  Musculoskeletal:        General: Normal range of motion.     Cervical back: Normal range of motion and neck supple.     Right lower leg: No edema.     Left lower leg: No edema.  Skin:    General: Skin is warm and dry.  Neurological:     General: No focal deficit present.     Mental Status: He is alert and oriented to person, place, and time.     Cranial Nerves: No cranial nerve deficit.     Motor: No weakness.  Psychiatric:        Mood and Affect: Mood normal.        Behavior: Behavior normal.      Results for orders placed or performed in visit on 03/08/24  POCT CBG (Fasting - Glucose)  Result Value Ref Range   Glucose Fasting, POC 150 (A) 70 - 99 mg/dL        Assessment & Plan:  As per problem list  Problem List Items Addressed This Visit        Cardiovascular and Mediastinum   Primary hypertension     Endocrine  Type 2 diabetes mellitus without complication (HCC) - Primary   Relevant Medications   pioglitazone  (ACTOS ) 15 MG tablet   INVOKANA  300 MG TABS tablet   Other Relevant Orders   POCT CBG (Fasting - Glucose) (Completed)   Hemoglobin A1c     Other   Mixed hyperlipidemia   Relevant Orders   Lipid panel   Comprehensive metabolic panel with GFR   Other Visit Diagnoses       Benign prostatic hyperplasia without lower urinary tract symptoms       Relevant Orders   PSA       Return in about 6 weeks (around 04/19/2024).   Total time spent: 20 minutes  Sherrill Cinderella Perry, MD  03/08/2024   This document may have been prepared by [facility] Voice Recognition software and as such may include unintentional dictation errors.

## 2024-03-09 ENCOUNTER — Other Ambulatory Visit: Payer: Self-pay | Admitting: Cardiovascular Disease

## 2024-03-14 ENCOUNTER — Other Ambulatory Visit: Payer: Self-pay | Admitting: Cardiovascular Disease

## 2024-03-18 ENCOUNTER — Telehealth: Payer: Self-pay

## 2024-03-18 NOTE — Telephone Encounter (Signed)
 Need to call patient and let him know that This patient's policy does not include DME (Durable Medical Equipment) coverage. Please contact the patient and request insurance information that includes DME coverage so we can proceed with processing this case that is from [employer]

## 2024-03-18 NOTE — Telephone Encounter (Signed)
 Patient informed and will call the insurance company and call back with what he can do about getting a machine

## 2024-03-22 ENCOUNTER — Other Ambulatory Visit: Payer: Self-pay | Admitting: Internal Medicine

## 2024-03-23 ENCOUNTER — Telehealth: Payer: Self-pay | Admitting: Internal Medicine

## 2024-03-23 NOTE — Telephone Encounter (Signed)
 Patient left VM asking for a call back about a refill that he requested from the pharmacy.

## 2024-03-31 ENCOUNTER — Telehealth: Payer: Self-pay

## 2024-03-31 ENCOUNTER — Other Ambulatory Visit: Payer: Self-pay

## 2024-03-31 DIAGNOSIS — E119 Type 2 diabetes mellitus without complications: Secondary | ICD-10-CM

## 2024-03-31 NOTE — Telephone Encounter (Signed)
 Patient called asking about supposed to be taking the metformin  1000mg ? He's been doubling up on his 500mg  xr and I tried changing the rx for 1000mg  for the xr and it wouldn't let me, please advise,  Patient is also asking about the ozempic  his insurance doesn't want to pay for it is there something else he can try

## 2024-04-04 ENCOUNTER — Other Ambulatory Visit: Payer: Self-pay | Admitting: Internal Medicine

## 2024-04-04 DIAGNOSIS — E119 Type 2 diabetes mellitus without complications: Secondary | ICD-10-CM

## 2024-04-04 MED ORDER — METFORMIN HCL ER 500 MG PO TB24: 1000.0000 mg | ORAL_TABLET | Freq: Every day | ORAL | 0 refills | Status: DC

## 2024-04-06 ENCOUNTER — Telehealth: Payer: Self-pay

## 2024-04-06 ENCOUNTER — Other Ambulatory Visit: Payer: Self-pay

## 2024-04-06 NOTE — Telephone Encounter (Signed)
 error

## 2024-04-07 MED ORDER — AMIODARONE HCL 200 MG PO TABS: ORAL_TABLET | ORAL | 0 refills | Status: AC

## 2024-04-14 ENCOUNTER — Telehealth: Payer: Self-pay

## 2024-04-14 ENCOUNTER — Other Ambulatory Visit: Payer: Self-pay

## 2024-04-14 NOTE — Telephone Encounter (Signed)
 Pt  called asking about his medications not being covered by his insurance, I have not received a prior auth for anyhing so I have reached back out to the patient to see if his pharmacy or insurance has sent any of those

## 2024-04-15 NOTE — Telephone Encounter (Signed)
 Patient informed, told to come Monday  to get samples

## 2024-04-17 ENCOUNTER — Other Ambulatory Visit: Payer: Self-pay | Admitting: Cardiology

## 2024-04-19 ENCOUNTER — Ambulatory Visit: Admitting: Internal Medicine

## 2024-04-26 ENCOUNTER — Other Ambulatory Visit

## 2024-04-27 LAB — HEMOGLOBIN A1C
Est. average glucose Bld gHb Est-mCnc: 128 mg/dL
Hgb A1c MFr Bld: 6.1 % — ABNORMAL HIGH (ref 4.8–5.6)

## 2024-04-27 LAB — COMPREHENSIVE METABOLIC PANEL WITH GFR
ALT: 26 IU/L (ref 0–44)
AST: 21 IU/L (ref 0–40)
Albumin: 4.2 g/dL (ref 3.8–4.9)
Alkaline Phosphatase: 57 IU/L (ref 47–123)
BUN/Creatinine Ratio: 18 (ref 9–20)
BUN: 19 mg/dL (ref 6–24)
Bilirubin Total: 0.7 mg/dL (ref 0.0–1.2)
CO2: 24 mmol/L (ref 20–29)
Calcium: 9.1 mg/dL (ref 8.7–10.2)
Chloride: 100 mmol/L (ref 96–106)
Creatinine, Ser: 1.04 mg/dL (ref 0.76–1.27)
Globulin, Total: 2.7 g/dL (ref 1.5–4.5)
Glucose: 143 mg/dL — ABNORMAL HIGH (ref 70–99)
Potassium: 4.1 mmol/L (ref 3.5–5.2)
Sodium: 139 mmol/L (ref 134–144)
Total Protein: 6.9 g/dL (ref 6.0–8.5)
eGFR: 85 mL/min/1.73 (ref >59)

## 2024-04-27 LAB — LIPID PANEL
Chol/HDL Ratio: 3.6 ratio (ref 0.0–5.0)
Cholesterol, Total: 137 mg/dL (ref 100–199)
HDL: 38 mg/dL — ABNORMAL LOW (ref >39)
LDL Chol Calc (NIH): 83 mg/dL (ref 0–99)
Triglycerides: 85 mg/dL (ref 0–149)
VLDL Cholesterol Cal: 16 mg/dL (ref 5–40)

## 2024-04-27 LAB — PSA: Prostate Specific Ag, Serum: 0.8 ng/mL (ref 0.0–4.0)

## 2024-05-01 ENCOUNTER — Other Ambulatory Visit: Payer: Self-pay | Admitting: Internal Medicine

## 2024-05-01 DIAGNOSIS — E119 Type 2 diabetes mellitus without complications: Secondary | ICD-10-CM

## 2024-05-03 ENCOUNTER — Ambulatory Visit: Payer: Self-pay | Admitting: Internal Medicine

## 2024-05-03 ENCOUNTER — Ambulatory Visit (INDEPENDENT_AMBULATORY_CARE_PROVIDER_SITE_OTHER): Admitting: Internal Medicine

## 2024-05-03 ENCOUNTER — Encounter: Payer: Self-pay | Admitting: Internal Medicine

## 2024-05-03 VITALS — BP 125/83 | HR 69 | Temp 97.5°F | Ht 66.0 in | Wt 252.4 lb

## 2024-05-03 DIAGNOSIS — F5101 Primary insomnia: Secondary | ICD-10-CM

## 2024-05-03 DIAGNOSIS — E559 Vitamin D deficiency, unspecified: Secondary | ICD-10-CM

## 2024-05-03 DIAGNOSIS — Z794 Long term (current) use of insulin: Secondary | ICD-10-CM | POA: Diagnosis not present

## 2024-05-03 DIAGNOSIS — N521 Erectile dysfunction due to diseases classified elsewhere: Secondary | ICD-10-CM | POA: Diagnosis not present

## 2024-05-03 DIAGNOSIS — Z0130 Encounter for examination of blood pressure without abnormal findings: Secondary | ICD-10-CM

## 2024-05-03 DIAGNOSIS — E119 Type 2 diabetes mellitus without complications: Secondary | ICD-10-CM | POA: Diagnosis not present

## 2024-05-03 LAB — POCT CBG (FASTING - GLUCOSE)-MANUAL ENTRY: Glucose Fasting, POC: 148 mg/dL — AB (ref 70–99)

## 2024-05-03 LAB — POC CREATINE & ALBUMIN,URINE
Creatinine, POC: 150 mg/dL
Microalbumin Ur, POC: 300 mg/L

## 2024-05-03 MED ORDER — SILDENAFIL CITRATE 100 MG PO TABS: 100.0000 mg | ORAL_TABLET | ORAL | 2 refills | Status: AC | PRN

## 2024-05-03 MED ORDER — VITAMIN D (CHOLECALCIFEROL) 50 MCG (2000 UT) PO CAPS: 1.0000 | ORAL_CAPSULE | Freq: Every day | ORAL | 0 refills | Status: DC

## 2024-05-03 MED ORDER — EROXON EX GEL: 1.0000 | CUTANEOUS | 5 refills | Status: AC

## 2024-05-03 MED ORDER — TRAZODONE HCL 50 MG PO TABS: 50.0000 mg | ORAL_TABLET | Freq: Every day | ORAL | 1 refills | Status: AC

## 2024-05-03 MED ORDER — ATORVASTATIN CALCIUM 40 MG PO TABS: 40.0000 mg | ORAL_TABLET | Freq: Every evening | ORAL | 0 refills | Status: AC

## 2024-05-03 MED ORDER — EMPAGLIFLOZIN 25 MG PO TABS: 25.0000 mg | ORAL_TABLET | Freq: Every day | ORAL | 0 refills | Status: AC

## 2024-05-03 NOTE — Progress Notes (Signed)
 Established Patient Office Visit  Subjective:  Patient ID: Steven Mcintosh, male    DOB: 09-02-1970  Age: 53 y.o. MRN: [id number]  Chief Complaint  Patient presents with   Follow-up    6 week follow up     No new complaints, here for lab review and medication refills. Labs reviewed and notable for well controlled diabetes, A1c improved and remains at target, lipids at target with unremarkable cmp. Denies any hypoglycemic episodes and home bg readings have been at target.     No other concerns at this time.   Past Medical History:  Diagnosis Date   Diabetes mellitus without complication (HCC)    Hypercholesteremia unk   Hypertension     Past Surgical History:  Procedure Laterality Date   DIAPHRAGM SURGERY     ENUCLEATION Left     Social History   Socioeconomic History   Marital status: Single    Spouse name: Not on file   Number of children: Not on file   Years of education: Not on file   Highest education level: Not on file  Occupational History   Not on file  Tobacco Use   Smoking status: Every Day    Types: Cigarettes   Smokeless tobacco: Not on file  Substance and Sexual Activity   Alcohol use: Yes   Drug use: Not Currently   Sexual activity: Yes  Other Topics Concern   Not on file  Social History Narrative   Not on file   Social Drivers of Health   Financial Resource Strain: Not on file  Food Insecurity: Not on file  Transportation Needs: Not on file  Physical Activity: Not on file  Stress: Not on file  Social Connections: Not on file  Intimate Partner Violence: Not on file    No family history on file.  No Known Allergies  Outpatient Medications Prior to Visit  Medication Sig   amiodarone  (PACERONE ) 200 MG tablet Take 1 tablet (200 mg total) by mouth daily for 90 days, THEN 1 tablet (200 mg total) daily.   apixaban  (ELIQUIS ) 5 MG TABS tablet Take 1 tablet (5 mg total) by mouth 2 (two) times daily.   CARTIA  XT 240 MG 24 hr capsule Take 1  capsule by mouth once daily   metFORMIN  (GLUCOPHAGE -XR) 500 MG 24 hr tablet TAKE 2 TABLETS BY MOUTH ONCE DAILY WITH BREAKFAST   metoprolol  tartrate (LOPRESSOR ) 50 MG tablet TAKE 1 & 1/2 (ONE & ONE-HALF) TABLETS BY MOUTH TWICE DAILY   OZEMPIC , 2 MG/DOSE, 8 MG/3ML SOPN INJECT 2MG  SUBUCTANEOUSLY ONCE A WEEK   pioglitazone  (ACTOS ) 15 MG tablet Take 15 mg by mouth daily.   [DISCONTINUED] atorvastatin  (LIPITOR) 40 MG tablet Take 1 tablet (40 mg total) by mouth every evening.   [DISCONTINUED] Cholecalciferol  (VITAMIN D3) 50 MCG (2000 UT) CAPS Take 1 capsule (2,000 Units total) by mouth daily in the afternoon.   [DISCONTINUED] INVOKANA  300 MG TABS tablet Take 1 tablet (300 mg total) by mouth every morning.   [DISCONTINUED] sildenafil  (VIAGRA ) 100 MG tablet Take 1 tablet (100 mg total) by mouth as needed for erectile dysfunction.   [DISCONTINUED] traZODone  (DESYREL ) 50 MG tablet Take 1 tablet (50 mg total) by mouth at bedtime.   cyclobenzaprine  (FLEXERIL ) 10 MG tablet TAKE 1 TABLET BY MOUTH THREE TIMES DAILY AS NEEDED FOR  NECK  PAIN (Patient not taking: Reported on 05/03/2024)   [DISCONTINUED] Intimacy Products (EROXON) GEL Apply 1 Package topically as directed for 4 doses. Apply one  tube to glans penis prior to intercourse (Patient not taking: Reported on 05/03/2024)   No facility-administered medications prior to visit.    Review of Systems  Constitutional: Negative.  Negative for weight loss.  HENT: Negative.    Eyes: Negative.   Respiratory: Negative.    Cardiovascular: Negative.   Gastrointestinal: Negative.   Genitourinary: Negative.   Skin: Negative.   Neurological: Negative.   Endo/Heme/Allergies: Negative.        Objective:   BP 125/83   Pulse 69   Temp (!) 97.5 F (36.4 C)   Ht 5' 6 (1.676 m)   Wt 252 lb 6.4 oz (114.5 kg)   SpO2 97%   BMI 40.74 kg/m   Vitals:   05/03/24 1054  BP: 125/83  Pulse: 69  Temp: (!) 97.5 F (36.4 C)  Height: 5' 6 (1.676 m)  Weight: 252 lb  6.4 oz (114.5 kg)  SpO2: 97%  BMI (Calculated): 40.76    Physical Exam Vitals reviewed.  Constitutional:      Appearance: Normal appearance. He is obese.  HENT:     Head: Normocephalic.     Left Ear: There is no impacted cerumen.     Nose: Nose normal.     Mouth/Throat:     Mouth: Mucous membranes are moist.     Pharynx: No posterior oropharyngeal erythema.  Eyes:     Extraocular Movements: Extraocular movements intact.     Pupils: Pupils are equal, round, and reactive to light.  Cardiovascular:     Rate and Rhythm: Regular rhythm.     Chest Wall: PMI is not displaced.     Pulses: Normal pulses.     Heart sounds: Normal heart sounds. No murmur heard. Pulmonary:     Effort: Pulmonary effort is normal.     Breath sounds: Normal air entry. No rhonchi or rales.  Abdominal:     General: Abdomen is flat. Bowel sounds are normal. There is no distension.     Palpations: Abdomen is soft. There is no hepatomegaly, splenomegaly or mass.     Tenderness: There is no abdominal tenderness.  Musculoskeletal:        General: Normal range of motion.     Cervical back: Normal range of motion and neck supple.     Right lower leg: No edema.     Left lower leg: No edema.  Skin:    General: Skin is warm and dry.  Neurological:     General: No focal deficit present.     Mental Status: He is alert and oriented to person, place, and time.     Cranial Nerves: No cranial nerve deficit.     Motor: No weakness.  Psychiatric:        Mood and Affect: Mood normal.        Behavior: Behavior normal.      Results for orders placed or performed in visit on 05/03/24  POCT CBG (Fasting - Glucose)  Result Value Ref Range   Glucose Fasting, POC 148 (A) 70 - 99 mg/dL    Recent Results (from the past 2160 hours)  CK     Status: None   Collection Time: 03/07/24  9:36 AM  Result Value Ref Range   Total CK 186 41 - 331 U/L  POCT CBG (Fasting - Glucose)     Status: Abnormal   Collection Time: 03/08/24  10:07 AM  Result Value Ref Range   Glucose Fasting, POC 150 (A) 70 - 99 mg/dL  PSA     Status: None   Collection Time: 04/26/24  8:46 AM  Result Value Ref Range   Prostate Specific Ag, Serum 0.8 0.0 - 4.0 ng/mL    Comment: Roche ECLIA methodology. According to the American Urological Association, Serum PSA should decrease and remain at undetectable levels after radical prostatectomy. The AUA defines biochemical recurrence as an initial PSA value 0.2 ng/mL or greater followed by a subsequent confirmatory PSA value 0.2 ng/mL or greater. Values obtained with different assay methods or kits cannot be used interchangeably. Results cannot be interpreted as absolute evidence of the presence or absence of malignant disease.   Hemoglobin A1c     Status: Abnormal   Collection Time: 04/26/24  8:46 AM  Result Value Ref Range   Hgb A1c MFr Bld 6.1 (H) 4.8 - 5.6 %    Comment:          Prediabetes: 5.7 - 6.4          Diabetes: >6.4          Glycemic control for adults with diabetes: <7.0    Est. average glucose Bld gHb Est-mCnc 128 mg/dL  Lipid panel     Status: Abnormal   Collection Time: 04/26/24  8:46 AM  Result Value Ref Range   Cholesterol, Total 137 100 - 199 mg/dL   Triglycerides 85 0 - 149 mg/dL   HDL 38 (L) >60 mg/dL   VLDL Cholesterol Cal 16 5 - 40 mg/dL   LDL Chol Calc (NIH) 83 0 - 99 mg/dL   Chol/HDL Ratio 3.6 0.0 - 5.0 ratio    Comment:                                   T. Chol/HDL Ratio                                             Men  Women                               1/2 Avg.Risk  3.4    3.3                                   Avg.Risk  5.0    4.4                                2X Avg.Risk  9.6    7.1                                3X Avg.Risk 23.4   11.0   Comprehensive metabolic panel with GFR     Status: Abnormal   Collection Time: 04/26/24  8:46 AM  Result Value Ref Range   Glucose 143 (H) 70 - 99 mg/dL   BUN 19 6 - 24 mg/dL   Creatinine, Ser 8.95 0.76 - 1.27 mg/dL    eGFR 85 >40 fO/fpw/8.26   BUN/Creatinine Ratio 18 9 - 20   Sodium 139 134 - 144 mmol/L   Potassium 4.1 3.5 - 5.2 mmol/L  Chloride 100 96 - 106 mmol/L   CO2 24 20 - 29 mmol/L   Calcium  9.1 8.7 - 10.2 mg/dL   Total Protein 6.9 6.0 - 8.5 g/dL   Albumin 4.2 3.8 - 4.9 g/dL   Globulin, Total 2.7 1.5 - 4.5 g/dL   Bilirubin Total 0.7 0.0 - 1.2 mg/dL   Alkaline Phosphatase 57 47 - 123 IU/L   AST 21 0 - 40 IU/L   ALT 26 0 - 44 IU/L  POCT CBG (Fasting - Glucose)     Status: Abnormal   Collection Time: 05/03/24 11:01 AM  Result Value Ref Range   Glucose Fasting, POC 148 (A) 70 - 99 mg/dL      Assessment & Plan:  Steven Mcintosh was seen today for follow-up.  Type 2 diabetes mellitus without complication, with long-term current use of insulin  (HCC) -     POCT CBG (Fasting - Glucose) -     Empagliflozin; Take 1 tablet (25 mg total) by mouth daily before breakfast.  Dispense: 90 tablet; Refill: 0  Erectile dysfunction Mcintosh to diseases classified elsewhere -     Sildenafil  Citrate; Take 1 tablet (100 mg total) by mouth as needed for erectile dysfunction.  Dispense: 15 tablet; Refill: 2 -     Eroxon; Apply 1 Package topically as directed for 4 doses. Apply one tube to glans penis prior to intercourse  Dispense: 1 each; Refill: 5  Vitamin D  deficiency -     Vitamin D  (Cholecalciferol ); Take 1 capsule (2,000 Units total) by mouth daily in the afternoon.  Dispense: 90 capsule; Refill: 0  Primary insomnia -     traZODone  HCl; Take 1 tablet (50 mg total) by mouth at bedtime.  Dispense: 30 tablet; Refill: 1  Type 2 diabetes mellitus without complication, without long-term current use of insulin  (HCC) -     Atorvastatin  Calcium ; Take 1 tablet (40 mg total) by mouth every evening.  Dispense: 90 tablet; Refill: 0    Problem List Items Addressed This Visit   None Visit Diagnoses       Type 2 diabetes mellitus without complication, with long-term current use of insulin  (HCC)    -  Primary   Relevant  Medications   atorvastatin  (LIPITOR) 40 MG tablet   empagliflozin (JARDIANCE) 25 MG TABS tablet   Other Relevant Orders   POCT CBG (Fasting - Glucose) (Completed)     Erectile dysfunction Mcintosh to diseases classified elsewhere       Relevant Medications   sildenafil  (VIAGRA ) 100 MG tablet   Intimacy Products (EROXON) GEL     Vitamin D  deficiency       Relevant Medications   Cholecalciferol  (VITAMIN D3) 50 MCG (2000 UT) CAPS     Primary insomnia       Relevant Medications   traZODone  (DESYREL ) 50 MG tablet     Type 2 diabetes mellitus without complication, without long-term current use of insulin  (HCC)       Relevant Medications   atorvastatin  (LIPITOR) 40 MG tablet   empagliflozin (JARDIANCE) 25 MG TABS tablet       Return in about 3 months (around 08/03/2024) for fu with labs prior.   Total time spent: 20 minutes  Sherrill Cinderella Perry, MD  05/03/2024   This document may have been prepared by [facility] Voice Recognition software and as such may include unintentional dictation errors.

## 2024-05-03 NOTE — Addendum Note (Signed)
 Addended by: BRYCE MARKER AHMAD on: 05/03/2024 12:17 PM   Modules accepted: Orders

## 2024-05-04 NOTE — Progress Notes (Signed)
Pt informed

## 2024-05-21 ENCOUNTER — Other Ambulatory Visit: Payer: Self-pay | Admitting: Cardiovascular Disease

## 2024-05-21 ENCOUNTER — Other Ambulatory Visit: Payer: Self-pay | Admitting: Cardiology

## 2024-05-22 ENCOUNTER — Other Ambulatory Visit: Payer: Self-pay | Admitting: Internal Medicine

## 2024-05-22 DIAGNOSIS — E119 Type 2 diabetes mellitus without complications: Secondary | ICD-10-CM

## 2024-05-26 ENCOUNTER — Ambulatory Visit (INDEPENDENT_AMBULATORY_CARE_PROVIDER_SITE_OTHER): Admitting: Cardiovascular Disease

## 2024-05-26 ENCOUNTER — Encounter: Payer: Self-pay | Admitting: Cardiovascular Disease

## 2024-05-26 VITALS — BP 136/84 | HR 62 | Ht 66.0 in | Wt 261.6 lb

## 2024-05-26 DIAGNOSIS — E559 Vitamin D deficiency, unspecified: Secondary | ICD-10-CM | POA: Diagnosis not present

## 2024-05-26 DIAGNOSIS — I4891 Unspecified atrial fibrillation: Secondary | ICD-10-CM

## 2024-05-26 DIAGNOSIS — E1121 Type 2 diabetes mellitus with diabetic nephropathy: Secondary | ICD-10-CM

## 2024-05-26 DIAGNOSIS — R0602 Shortness of breath: Secondary | ICD-10-CM

## 2024-05-26 DIAGNOSIS — E782 Mixed hyperlipidemia: Secondary | ICD-10-CM

## 2024-05-26 DIAGNOSIS — E669 Obesity, unspecified: Secondary | ICD-10-CM

## 2024-05-26 DIAGNOSIS — I10 Essential (primary) hypertension: Secondary | ICD-10-CM

## 2024-05-26 DIAGNOSIS — Z8679 Personal history of other diseases of the circulatory system: Secondary | ICD-10-CM

## 2024-05-26 MED ORDER — DILTIAZEM HCL ER COATED BEADS 240 MG PO CP24
240.0000 mg | ORAL_CAPSULE | Freq: Every day | ORAL | 0 refills | Status: AC
Start: 2024-05-26 — End: ?

## 2024-05-26 MED ORDER — VITAMIN D (CHOLECALCIFEROL) 50 MCG (2000 UT) PO CAPS: 1.0000 | ORAL_CAPSULE | Freq: Every day | ORAL | 0 refills | Status: AC

## 2024-05-26 NOTE — Progress Notes (Signed)
 Cardiology Office Note   Date:  05/26/2024   ID:  Steven Mcintosh, DOB Apr 27, 1971, MRN [id number]  PCP:  Albina GORMAN Dine, MD  Cardiologist:  Denyse Bathe, MD      History of Present Illness: Steven Mcintosh is a 53 y.o. male who presents for  Chief Complaint  Patient presents with   Follow-up    Med refills    No chest pain or SOB.      Past Medical History:  Diagnosis Date   Diabetes mellitus without complication (HCC)    Hypercholesteremia unk   Hypertension      Past Surgical History:  Procedure Laterality Date   DIAPHRAGM SURGERY     ENUCLEATION Left      Current Outpatient Medications  Medication Sig Dispense Refill   amiodarone  (PACERONE ) 200 MG tablet Take 1 tablet (200 mg total) by mouth daily for 90 days, THEN 1 tablet (200 mg total) daily. 180 tablet 0   apixaban  (ELIQUIS ) 5 MG TABS tablet Take 1 tablet (5 mg total) by mouth 2 (two) times daily. 60 tablet 11   atorvastatin  (LIPITOR) 40 MG tablet Take 1 tablet (40 mg total) by mouth every evening. 90 tablet 0   empagliflozin (JARDIANCE) 25 MG TABS tablet Take 1 tablet (25 mg total) by mouth daily before breakfast. 90 tablet 0   Intimacy Products (EROXON) GEL Apply 1 Package topically as directed for 4 doses. Apply one tube to glans penis prior to intercourse 1 each 5   metFORMIN  (GLUCOPHAGE -XR) 500 MG 24 hr tablet TAKE 2 TABLETS BY MOUTH ONCE DAILY WITH BREAKFAST 60 tablet 3   metoprolol  tartrate (LOPRESSOR ) 50 MG tablet TAKE ONE AND ONE-HALF TABLETS BY MOUTH TWICE DAILY 90 tablet 0   sildenafil  (VIAGRA ) 100 MG tablet Take 1 tablet (100 mg total) by mouth as needed for erectile dysfunction. 15 tablet 2   traZODone  (DESYREL ) 50 MG tablet Take 1 tablet (50 mg total) by mouth at bedtime. 30 tablet 1   Cholecalciferol  (VITAMIN D3) 50 MCG (2000 UT) CAPS Take 1 capsule (2,000 Units total) by mouth daily in the afternoon. 90 capsule 0   diltiazem  (CARTIA  XT) 240 MG 24 hr capsule Take 1 capsule (240 mg total)  by mouth daily. 30 capsule 0   No current facility-administered medications for this visit.    Allergies:   Patient has no known allergies.    Social History:   reports that he has been smoking cigarettes. He does not have any smokeless tobacco history on file. He reports current alcohol use. He reports that he does not currently use drugs.   Family History:  family history is not on file.    ROS:     Review of Systems  Constitutional: Negative.   HENT: Negative.    Eyes: Negative.   Respiratory: Negative.    Gastrointestinal: Negative.   Genitourinary: Negative.   Musculoskeletal: Negative.   Skin: Negative.   Neurological: Negative.   Endo/Heme/Allergies: Negative.   Psychiatric/Behavioral: Negative.    All other systems reviewed and are negative.     All other systems are reviewed and negative.    PHYSICAL EXAM: VS:  BP 136/84   Pulse 62   Ht 5' 6 (1.676 m)   Wt 261 lb 9.6 oz (118.7 kg)   SpO2 94%   BMI 42.22 kg/m  , BMI Body mass index is 42.22 kg/m. Last weight:  Wt Readings from Last 3 Encounters:  05/26/24 261 lb 9.6 oz (118.7  kg)  05/03/24 252 lb 6.4 oz (114.5 kg)  03/08/24 251 lb 6.4 oz (114 kg)     Physical Exam Vitals reviewed.  Constitutional:      Appearance: Normal appearance. He is normal weight.  HENT:     Head: Normocephalic.     Nose: Nose normal.     Mouth/Throat:     Mouth: Mucous membranes are moist.  Eyes:     Pupils: Pupils are equal, round, and reactive to light.  Cardiovascular:     Rate and Rhythm: Normal rate and regular rhythm.     Pulses: Normal pulses.     Heart sounds: Normal heart sounds.  Pulmonary:     Effort: Pulmonary effort is normal.  Abdominal:     General: Abdomen is flat. Bowel sounds are normal.  Musculoskeletal:        General: Normal range of motion.     Cervical back: Normal range of motion.  Skin:    General: Skin is warm.  Neurological:     General: No focal deficit present.     Mental Status:  He is alert.  Psychiatric:        Mood and Affect: Mood normal.       EKG:   Recent Labs: 01/14/2024: Magnesium  1.7 01/21/2024: Hemoglobin 17.3; Platelets 279 04/26/2024: ALT 26; BUN 19; Creatinine, Ser 1.04; Potassium 4.1; Sodium 139    Lipid Panel    Component Value Date/Time   CHOL 137 04/26/2024 0846   TRIG 85 04/26/2024 0846   HDL 38 (L) 04/26/2024 0846   CHOLHDL 3.6 04/26/2024 0846   LDLCALC 83 04/26/2024 0846      Other studies Reviewed: Additional studies/ records that were reviewed today include:  Review of the above records demonstrates:       No data to display            ASSESSMENT AND PLAN:    ICD-10-CM   1. Primary hypertension  I10 diltiazem  (CARTIA  XT) 240 MG 24 hr capsule    2. Vitamin D  deficiency  E55.9 Cholecalciferol  (VITAMIN D3) 50 MCG (2000 UT) CAPS    diltiazem  (CARTIA  XT) 240 MG 24 hr capsule    3. Diabetic nephropathy associated with type 2 diabetes mellitus (HCC)  E11.21 diltiazem  (CARTIA  XT) 240 MG 24 hr capsule    4. Mixed hyperlipidemia  E78.2 diltiazem  (CARTIA  XT) 240 MG 24 hr capsule    5. Atrial fibrillation, currently in sinus rhythm  Z86.79 diltiazem  (CARTIA  XT) 240 MG 24 hr capsule    6. SOB (shortness of breath)  R06.02 diltiazem  (CARTIA  XT) 240 MG 24 hr capsule    7. Obesity (BMI 35.0-39.9 without comorbidity)  E66.9 diltiazem  (CARTIA  XT) 240 MG 24 hr capsule    8. New onset atrial fibrillation (HCC)  I48.91 diltiazem  (CARTIA  XT) 240 MG 24 hr capsule   On amiodrone and elliquis, and in NSR. Stress test not done as had insurance issue. ECHO at [facility] LVEF 65%, grade 2 diastolic dysfunction.       Problem List Items Addressed This Visit       Cardiovascular and Mediastinum   Primary hypertension - Primary   Relevant Medications   diltiazem  (CARTIA  XT) 240 MG 24 hr capsule     Endocrine   Diabetic nephropathy associated with type 2 diabetes mellitus (HCC)   Relevant Medications   diltiazem  (CARTIA  XT) 240 MG 24 hr  capsule     Other   Mixed hyperlipidemia   Relevant Medications  diltiazem  (CARTIA  XT) 240 MG 24 hr capsule   Other Visit Diagnoses       Vitamin D  deficiency       Relevant Medications   Cholecalciferol  (VITAMIN D3) 50 MCG (2000 UT) CAPS   diltiazem  (CARTIA  XT) 240 MG 24 hr capsule     Atrial fibrillation, currently in sinus rhythm       Relevant Medications   diltiazem  (CARTIA  XT) 240 MG 24 hr capsule     SOB (shortness of breath)       Relevant Medications   diltiazem  (CARTIA  XT) 240 MG 24 hr capsule     Obesity (BMI 35.0-39.9 without comorbidity)       Relevant Medications   diltiazem  (CARTIA  XT) 240 MG 24 hr capsule     New onset atrial fibrillation (HCC)       On amiodrone and elliquis, and in NSR. Stress test not done as had insurance issue. ECHO at [facility] LVEF 65%, grade 2 diastolic dysfunction.   Relevant Medications   diltiazem  (CARTIA  XT) 240 MG 24 hr capsule          Disposition:   Return in about 4 weeks (around 06/23/2024).    Total time spent: 30 minutes  Signed,  Denyse Bathe, MD  05/26/2024 10:26 AM    [facility]

## 2024-06-17 ENCOUNTER — Other Ambulatory Visit: Payer: Self-pay | Admitting: Cardiovascular Disease

## 2024-06-27 ENCOUNTER — Ambulatory Visit: Admitting: Cardiovascular Disease

## 2024-06-27 ENCOUNTER — Telehealth: Payer: Self-pay

## 2024-06-27 NOTE — Telephone Encounter (Signed)
 Patient pharmacy doesn't have the 75 mg metoprolol  in stock is it okay if we switch him to 50mg  bid  instead and send in the 100mg  1/2 bid?

## 2024-06-28 ENCOUNTER — Other Ambulatory Visit: Payer: Self-pay

## 2024-06-28 MED ORDER — METOPROLOL TARTRATE 100 MG PO TABS: ORAL_TABLET | ORAL | 1 refills | Status: AC

## 2024-06-28 NOTE — Telephone Encounter (Signed)
 Patient informed.

## 2024-07-24 ENCOUNTER — Other Ambulatory Visit: Payer: Self-pay | Admitting: Cardiovascular Disease

## 2024-07-25 ENCOUNTER — Other Ambulatory Visit: Payer: Self-pay | Admitting: Cardiovascular Disease

## 2024-08-02 ENCOUNTER — Ambulatory Visit: Admitting: Cardiovascular Disease

## 2024-08-23 ENCOUNTER — Ambulatory Visit: Admitting: Internal Medicine

## 2024-09-02 ENCOUNTER — Other Ambulatory Visit

## 2024-09-02 DIAGNOSIS — E119 Type 2 diabetes mellitus without complications: Secondary | ICD-10-CM

## 2024-09-12 ENCOUNTER — Ambulatory Visit: Admitting: Internal Medicine
